# Patient Record
Sex: Female | Born: 1985 | Race: Black or African American | Hispanic: No | Marital: Single | Smoking: Never smoker
Health system: Southern US, Community
[De-identification: ages and names within clinical notes are randomized; demographics above are authoritative.]

## PROBLEM LIST (undated history)

## (undated) DIAGNOSIS — B019 Varicella without complication: Secondary | ICD-10-CM

## (undated) DIAGNOSIS — R519 Headache, unspecified: Secondary | ICD-10-CM

## (undated) DIAGNOSIS — D649 Anemia, unspecified: Secondary | ICD-10-CM

## (undated) DIAGNOSIS — Z8489 Family history of other specified conditions: Secondary | ICD-10-CM

## (undated) DIAGNOSIS — R51 Headache: Secondary | ICD-10-CM

## (undated) DIAGNOSIS — A6000 Herpesviral infection of urogenital system, unspecified: Secondary | ICD-10-CM

## (undated) DIAGNOSIS — T7840XA Allergy, unspecified, initial encounter: Secondary | ICD-10-CM

## (undated) HISTORY — DX: Varicella without complication: B01.9

## (undated) HISTORY — DX: Herpesviral infection of urogenital system, unspecified: A60.00

## (undated) HISTORY — DX: Allergy, unspecified, initial encounter: T78.40XA

## (undated) HISTORY — DX: Headache, unspecified: R51.9

## (undated) HISTORY — DX: Anemia, unspecified: D64.9

## (undated) HISTORY — DX: Headache: R51

---
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
## 2003-03-15 ENCOUNTER — Other Ambulatory Visit: Admission: RE | Admit: 2003-03-15 | Discharge: 2003-03-15 | Payer: Self-pay | Admitting: Obstetrics and Gynecology

## 2003-09-06 ENCOUNTER — Inpatient Hospital Stay (HOSPITAL_COMMUNITY): Admission: AD | Admit: 2003-09-06 | Discharge: 2003-09-06 | Payer: Self-pay | Admitting: Obstetrics and Gynecology

## 2003-10-17 ENCOUNTER — Inpatient Hospital Stay (HOSPITAL_COMMUNITY): Admission: AD | Admit: 2003-10-17 | Discharge: 2003-10-17 | Payer: Self-pay | Admitting: Obstetrics and Gynecology

## 2003-10-19 ENCOUNTER — Inpatient Hospital Stay (HOSPITAL_COMMUNITY): Admission: AD | Admit: 2003-10-19 | Discharge: 2003-10-19 | Payer: Self-pay | Admitting: Obstetrics and Gynecology

## 2003-10-23 ENCOUNTER — Inpatient Hospital Stay (HOSPITAL_COMMUNITY): Admission: AD | Admit: 2003-10-23 | Discharge: 2003-10-23 | Payer: Self-pay | Admitting: Obstetrics and Gynecology

## 2003-10-28 ENCOUNTER — Inpatient Hospital Stay (HOSPITAL_COMMUNITY): Admission: AD | Admit: 2003-10-28 | Discharge: 2003-10-31 | Payer: Self-pay | Admitting: Obstetrics and Gynecology

## 2003-12-01 ENCOUNTER — Other Ambulatory Visit: Admission: RE | Admit: 2003-12-01 | Discharge: 2003-12-01 | Payer: Self-pay | Admitting: Obstetrics and Gynecology

## 2004-12-27 ENCOUNTER — Other Ambulatory Visit: Admission: RE | Admit: 2004-12-27 | Discharge: 2004-12-27 | Payer: Self-pay | Admitting: Obstetrics and Gynecology

## 2006-04-04 ENCOUNTER — Emergency Department: Payer: Self-pay | Admitting: Emergency Medicine

## 2011-04-02 HISTORY — PX: TUBAL LIGATION: SHX77

## 2014-12-31 DIAGNOSIS — A6000 Herpesviral infection of urogenital system, unspecified: Secondary | ICD-10-CM

## 2014-12-31 HISTORY — DX: Herpesviral infection of urogenital system, unspecified: A60.00

## 2015-09-12 ENCOUNTER — Encounter: Payer: Self-pay | Admitting: Family Medicine

## 2015-09-12 ENCOUNTER — Ambulatory Visit (INDEPENDENT_AMBULATORY_CARE_PROVIDER_SITE_OTHER): Payer: BLUE CROSS/BLUE SHIELD | Admitting: Family Medicine

## 2015-09-12 VITALS — BP 144/99 | HR 74 | Temp 98.2°F | Ht 62.5 in | Wt 144.0 lb

## 2015-09-12 DIAGNOSIS — Z13220 Encounter for screening for lipoid disorders: Secondary | ICD-10-CM

## 2015-09-12 DIAGNOSIS — R51 Headache: Secondary | ICD-10-CM

## 2015-09-12 DIAGNOSIS — Z0001 Encounter for general adult medical examination with abnormal findings: Secondary | ICD-10-CM

## 2015-09-12 DIAGNOSIS — Z130 Encounter for screening for diseases of the blood and blood-forming organs and certain disorders involving the immune mechanism: Secondary | ICD-10-CM | POA: Diagnosis not present

## 2015-09-12 DIAGNOSIS — R5383 Other fatigue: Secondary | ICD-10-CM | POA: Diagnosis not present

## 2015-09-12 DIAGNOSIS — R6889 Other general symptoms and signs: Secondary | ICD-10-CM

## 2015-09-12 DIAGNOSIS — R030 Elevated blood-pressure reading, without diagnosis of hypertension: Secondary | ICD-10-CM | POA: Diagnosis not present

## 2015-09-12 DIAGNOSIS — R519 Headache, unspecified: Secondary | ICD-10-CM

## 2015-09-12 DIAGNOSIS — IMO0001 Reserved for inherently not codable concepts without codable children: Secondary | ICD-10-CM

## 2015-09-12 NOTE — Patient Instructions (Signed)
It was nice to see you today.  We will call with your lab results.  Follow up:  2 weeks for BP check.  Take care  Dr. Lacinda Axon

## 2015-09-12 NOTE — Progress Notes (Signed)
Pre visit review using our clinic review tool, if applicable. No additional management support is needed unless otherwise documented below in the visit note. 

## 2015-09-13 DIAGNOSIS — R51 Headache: Secondary | ICD-10-CM

## 2015-09-13 DIAGNOSIS — R519 Headache, unspecified: Secondary | ICD-10-CM | POA: Insufficient documentation

## 2015-09-13 DIAGNOSIS — I10 Essential (primary) hypertension: Secondary | ICD-10-CM | POA: Insufficient documentation

## 2015-09-13 DIAGNOSIS — R5383 Other fatigue: Secondary | ICD-10-CM | POA: Insufficient documentation

## 2015-09-13 DIAGNOSIS — Z0001 Encounter for general adult medical examination with abnormal findings: Secondary | ICD-10-CM | POA: Insufficient documentation

## 2015-09-13 DIAGNOSIS — R030 Elevated blood-pressure reading, without diagnosis of hypertension: Secondary | ICD-10-CM

## 2015-09-13 LAB — COMPREHENSIVE METABOLIC PANEL
ALT: 9 U/L (ref 0–35)
AST: 13 U/L (ref 0–37)
Albumin: 4.3 g/dL (ref 3.5–5.2)
Alkaline Phosphatase: 52 U/L (ref 39–117)
BUN: 9 mg/dL (ref 6–23)
CALCIUM: 9.5 mg/dL (ref 8.4–10.5)
CHLORIDE: 105 meq/L (ref 96–112)
CO2: 24 meq/L (ref 19–32)
CREATININE: 0.88 mg/dL (ref 0.40–1.20)
GFR: 95.87 mL/min (ref >60.00)
Glucose, Bld: 80 mg/dL (ref 70–99)
Potassium: 4.1 mEq/L (ref 3.5–5.1)
Sodium: 137 mEq/L (ref 135–145)
TOTAL PROTEIN: 7.3 g/dL (ref 6.0–8.3)
Total Bilirubin: 0.5 mg/dL (ref 0.2–1.2)

## 2015-09-13 LAB — LIPID PANEL
CHOL/HDL RATIO: 3
CHOLESTEROL: 157 mg/dL (ref 0–200)
HDL: 60.4 mg/dL (ref >39.00)
LDL CALC: 85 mg/dL (ref 0–99)
NonHDL: 96.58
TRIGLYCERIDES: 60 mg/dL (ref 0.0–149.0)
VLDL: 12 mg/dL (ref 0.0–40.0)

## 2015-09-13 LAB — CBC
HEMATOCRIT: 30.3 % — AB (ref 36.0–46.0)
HEMOGLOBIN: 9.5 g/dL — AB (ref 12.0–15.0)
MCHC: 31.4 g/dL (ref 30.0–36.0)
MCV: 74.4 fl — ABNORMAL LOW (ref 78.0–100.0)
PLATELETS: 364 10*3/uL (ref 150.0–400.0)
RBC: 4.06 Mil/uL (ref 3.87–5.11)
RDW: 18.5 % — ABNORMAL HIGH (ref 11.5–15.5)
WBC: 6.9 10*3/uL (ref 4.0–10.5)

## 2015-09-13 LAB — TSH: TSH: 1.04 u[IU]/mL (ref 0.35–4.50)

## 2015-09-13 NOTE — Assessment & Plan Note (Signed)
New problem. BP elevated today and on repeat. Patient to keep a log of her blood pressures. Return in 2 weeks for repeat check

## 2015-09-13 NOTE — Assessment & Plan Note (Signed)
New problem. Unclear etiology. Feeling better today. Will follow.

## 2015-09-13 NOTE — Assessment & Plan Note (Signed)
New problem.  ? Tension headache. No headache currently. Advised PRN Tylenol/motrin.

## 2015-09-13 NOTE — Progress Notes (Signed)
Subjective:  Patient ID: Vanessa Chaney, female    DOB: 1985-12-08  Age: 30 y.o. MRN: [id number]  CC: Establish care; Not feeling well.   HPI Vanessa Chaney is a 30 y.o. female presents to the clinic today to establish care. Also states she's not feeling well.  Preventative Healthcare  Pap smear: Up to date. June 2015.  Mammogram: N/A.  Immunizations  Tetanus - 2013.   Labs: Will wait on labs at this time.   Exercise: Does not exercise regular.  Alcohol use: See below.  Smoking/tobacco use: Nonsmoker.  STD/HIV testing: Has had prior screening.  Regular dental exams: Yes.   Wears seat belt: Yes.   Not feeling well  Patient reports that she's not been feeling well for the past week.  She states that for 5 days ago she had nausea for 2 days and this subsequently resolved.  Patient is also been experiencing headache over the past week.  Headache is located in the frontal and parietal regions. Moderate in severity.  No known exacerbating or relieving factors. She states that she does not have a headache at this time.  She states that given her symptoms, she's been taking her blood pressure at work and it has been elevated in the 573U to 202 systolic.  She is concerned about this.  Additionally, patient reports fatigue.  PMH, Surgical Hx, Family Hx, Social History reviewed and updated as below.  Past Medical History  Diagnosis Date  . Chicken pox   . Frequent headaches   . Allergy    Past Surgical History  Procedure Laterality Date  . Cesarean section  2005,2009,2013  . Tubal ligation  2013   Family History  Problem Relation Age of Onset  . Hypertension Mother   . Prostate cancer Father    Social History  Substance Use Topics  . Smoking status: Never Smoker   . Smokeless tobacco: Never Used  . Alcohol Use: 0.0 - 0.6 oz/week    0-1 Standard drinks or equivalent per week   Review of Systems  Constitutional: Positive for fatigue.  Neurological:  Positive for headaches.  All other systems reviewed and are negative.  Objective:   Today's Vitals: BP 144/99 mmHg  Pulse 74  Temp(Src) 98.2 F (36.8 C) (Oral)  Ht 5' 2.5" (1.588 m)  Wt 144 lb (65.318 kg)  BMI 25.90 kg/m2  SpO2 99%  Physical Exam  Constitutional: She is oriented to person, place, and time. She appears well-developed and well-nourished. No distress.  HENT:  Head: Normocephalic and atraumatic.  Nose: Nose normal.  Mouth/Throat: Oropharynx is clear and moist. No oropharyngeal exudate.  Normal TM's bilaterally.   Eyes: Conjunctivae are normal. No scleral icterus.  Neck: Neck supple. No thyromegaly present.  Cardiovascular: Normal rate and regular rhythm.   No murmur heard. Pulmonary/Chest: Effort normal and breath sounds normal. She has no wheezes. She has no rales.  Abdominal: Soft. She exhibits no distension. There is no tenderness. There is no rebound and no guarding.  Musculoskeletal: Normal range of motion. She exhibits no edema.  Lymphadenopathy:    She has no cervical adenopathy.  Neurological: She is alert and oriented to person, place, and time.  Skin: Skin is warm and dry. No rash noted.  Psychiatric: She has a normal mood and affect.  Vitals reviewed.  Assessment & Plan:   Problem List Items Addressed This Visit    Elevated BP    New problem. BP elevated today and on repeat. Patient to keep  a log of her blood pressures. Return in 2 weeks for repeat check      Relevant Orders   Comp Met (CMET)   Encounter for preventative adult health care exam with abnormal findings - Primary    Preventative healthcare up-to-date - has had pap smear, tdap, HIV/STI screening.        Headache    New problem.  ? Tension headache. No headache currently. Advised PRN Tylenol/motrin.      Other fatigue    New problem. Unclear etiology. Feeling better today. Will follow.      Relevant Orders   TSH    Other Visit Diagnoses    Screening for deficiency  anemia        Relevant Orders    CBC    Screening, lipid        Relevant Orders    Lipid Profile      Follow-up: 2 weeks for BP check with Nurse  Gerber

## 2015-09-13 NOTE — Assessment & Plan Note (Signed)
Preventative healthcare up-to-date - has had pap smear, tdap, HIV/STI screening.

## 2015-09-20 ENCOUNTER — Other Ambulatory Visit: Payer: Self-pay | Admitting: Family Medicine

## 2015-09-20 ENCOUNTER — Other Ambulatory Visit (INDEPENDENT_AMBULATORY_CARE_PROVIDER_SITE_OTHER): Payer: BLUE CROSS/BLUE SHIELD

## 2015-09-20 ENCOUNTER — Telehealth: Payer: Self-pay | Admitting: *Deleted

## 2015-09-20 DIAGNOSIS — D649 Anemia, unspecified: Secondary | ICD-10-CM

## 2015-09-20 LAB — RETICULOCYTES
ABS RETIC: 37710 {cells}/uL (ref 20000–80000)
RBC.: 4.19 MIL/uL (ref 3.80–5.10)
Retic Ct Pct: 0.9 %

## 2015-09-20 NOTE — Telephone Encounter (Signed)
Pt coming in for labs this afternoon. Need lab & dx please

## 2015-09-21 ENCOUNTER — Other Ambulatory Visit: Payer: Self-pay | Admitting: Family Medicine

## 2015-09-21 LAB — IRON AND TIBC
%SAT: 7 % — ABNORMAL LOW (ref 11–50)
IRON: 28 ug/dL — AB (ref 40–190)
TIBC: 397 ug/dL (ref 250–450)
UIBC: 369 ug/dL (ref 125–400)

## 2015-09-21 LAB — IBC PANEL
IRON: 27 ug/dL — AB (ref 42–145)
Saturation Ratios: 5.8 % — ABNORMAL LOW (ref 20.0–50.0)
Transferrin: 331 mg/dL (ref 212.0–360.0)

## 2015-09-21 LAB — FERRITIN: FERRITIN: 4.2 ng/mL — AB (ref 10.0–291.0)

## 2015-09-21 LAB — IRON: Iron: 27 ug/dL — ABNORMAL LOW (ref 42–145)

## 2015-09-21 MED ORDER — FERROUS SULFATE 324 (65 FE) MG PO TBEC
DELAYED_RELEASE_TABLET | ORAL | Status: DC
Start: 2015-09-21 — End: 2016-06-04

## 2015-09-22 ENCOUNTER — Telehealth: Payer: Self-pay | Admitting: *Deleted

## 2015-09-22 NOTE — Telephone Encounter (Signed)
Patient requested lab results from 09/20/15 Patient contact [phone]

## 2015-09-22 NOTE — Telephone Encounter (Signed)
Left message for patient to call back for results.  

## 2015-09-27 ENCOUNTER — Telehealth: Payer: Self-pay | Admitting: Family Medicine

## 2015-09-27 NOTE — Telephone Encounter (Signed)
Results note was closed.  Documented here that spoke with patient, reviewed results with her. thanks

## 2015-09-27 NOTE — Telephone Encounter (Signed)
Pt called to get her lab results from 09/20/15. Thank you!  Call pt @ [phone].

## 2016-06-04 ENCOUNTER — Encounter: Payer: Self-pay | Admitting: Obstetrics and Gynecology

## 2016-06-04 ENCOUNTER — Ambulatory Visit (INDEPENDENT_AMBULATORY_CARE_PROVIDER_SITE_OTHER): Payer: BLUE CROSS/BLUE SHIELD | Admitting: Obstetrics and Gynecology

## 2016-06-04 VITALS — BP 116/72 | Ht 62.0 in | Wt 156.0 lb

## 2016-06-04 DIAGNOSIS — Z113 Encounter for screening for infections with a predominantly sexual mode of transmission: Secondary | ICD-10-CM

## 2016-06-04 DIAGNOSIS — A6004 Herpesviral vulvovaginitis: Secondary | ICD-10-CM | POA: Diagnosis not present

## 2016-06-04 DIAGNOSIS — N898 Other specified noninflammatory disorders of vagina: Secondary | ICD-10-CM

## 2016-06-04 DIAGNOSIS — Z124 Encounter for screening for malignant neoplasm of cervix: Secondary | ICD-10-CM

## 2016-06-04 DIAGNOSIS — N760 Acute vaginitis: Secondary | ICD-10-CM

## 2016-06-04 DIAGNOSIS — B9689 Other specified bacterial agents as the cause of diseases classified elsewhere: Secondary | ICD-10-CM | POA: Insufficient documentation

## 2016-06-04 DIAGNOSIS — Z01419 Encounter for gynecological examination (general) (routine) without abnormal findings: Secondary | ICD-10-CM | POA: Diagnosis not present

## 2016-06-04 LAB — POCT WET PREP WITH KOH
CLUE CELLS WET PREP PER HPF POC: NEGATIVE
KOH PREP POC: NEGATIVE
TRICHOMONAS UA: NEGATIVE
YEAST WET PREP PER HPF POC: NEGATIVE

## 2016-06-04 MED ORDER — VALACYCLOVIR HCL 500 MG PO TABS: ORAL_TABLET | ORAL | 11 refills | Status: DC

## 2016-06-04 NOTE — Progress Notes (Signed)
HPI:      [name] is a 31 y.o. [id number] who LMP was Patient's last menstrual period was 05/22/2016 (exact date)., presents today for her annual examination.  Her menses are regular every 28-30 days, lasting 5 day(s). Dysmenorrhea none.   She is single partner, contraception - tubal ligation.  Last Pap: 02/14/14 Results were: no abnormalities  /neg HPV DNA Hx of STDs: HSV. She has outbreaks every couple of months. She takes valtrex prn sx. She needs a Rx Rf. She would like full STD screen as preventive. She has had vaginal d/c and odor and hx of BV in the past, but no sx today other than d/c.   There is no FH of breast cancer. MGGM had ovar cancer. Genetic tesitng not indicated. The patient does not do self-breast exams.  Tobacco use: The patient denies current or previous tobacco use. Alcohol use: none Exercise: moderately active  She does get adequate calcium and Vitamin D in her diet.   Past Medical History:  Diagnosis Date  . Allergy   . Chicken pox   . Frequent headaches   . Genital herpes 12/2014   pos HSV culture    Past Surgical History:  Procedure Laterality Date  . CESAREAN SECTION  2005,2009,2013  . TUBAL LIGATION  2013    Family History  Problem Relation Age of Onset  . Hypertension Mother   . Prostate cancer Father   . Uterine cancer Paternal Aunt 105  . Ovarian cancer Other      ROS:  Review of Systems  Constitutional: Negative for fever, malaise/fatigue and weight loss.  HENT: Negative for congestion, ear pain and sinus pain.   Respiratory: Negative for cough, shortness of breath and wheezing.   Cardiovascular: Negative for chest pain, orthopnea and leg swelling.  Gastrointestinal: Negative for constipation, diarrhea, nausea and vomiting.  Genitourinary: Negative for dysuria, frequency, hematuria and urgency.       Breast ROS: negative   Musculoskeletal: Negative for back pain, joint pain and myalgias.  Skin: Negative for itching and  rash.  Neurological: Positive for headaches. Negative for dizziness, tingling and focal weakness.  Endo/Heme/Allergies: Negative for environmental allergies. Does not bruise/bleed easily.  Psychiatric/Behavioral: Negative for depression and suicidal ideas. The patient is not nervous/anxious and does not have insomnia.     Objective: BP 116/72   Ht 5\' 2"  (1.575 m)   Wt 156 lb (70.8 kg)   LMP 05/22/2016 (Exact Date)   BMI 28.53 kg/m    Physical Exam  Constitutional: She is oriented to person, place, and time. She appears well-developed and well-nourished.  Genitourinary: Uterus normal. No erythema or tenderness in the vagina. Vaginal discharge found. Right adnexum does not display mass and does not display tenderness. Left adnexum does not display mass and does not display tenderness. Cervix does not exhibit motion tenderness or polyp. Uterus is not enlarged or tender.  Neck: Normal range of motion. No thyromegaly present.  Cardiovascular: Normal rate, regular rhythm and normal heart sounds.   No murmur heard. Pulmonary/Chest: Effort normal and breath sounds normal. Right breast exhibits no mass, no nipple discharge, no skin change and no tenderness. Left breast exhibits no mass, no nipple discharge, no skin change and no tenderness.  Abdominal: Soft. There is no tenderness. There is no guarding.  Musculoskeletal: Normal range of motion.  Neurological: She is alert and oriented to person, place, and time. No cranial nerve deficit.  Psychiatric: She has a normal mood and  affect. Her behavior is normal.  Vitals reviewed.   Results: Results for orders placed or performed in visit on 06/04/16 (from the past 24 hour(s))  POCT Wet Prep with KOH     Status: Normal   Collection Time: 06/04/16  4:00 PM  Result Value Ref Range   Trichomonas, UA Negative    Clue Cells Wet Prep HPF POC neg    Epithelial Wet Prep HPF POC  Few, Moderate, Many, Too numerous to count   Yeast Wet Prep HPF POC neg      Bacteria Wet Prep HPF POC Few Few   RBC Wet Prep HPF POC     WBC Wet Prep HPF POC     KOH Prep POC Negative Negative    Assessment: Encounter for annual routine gynecological examination - Plan: IGP,CtNg,AptimaHPV  Screening for STD (sexually transmitted disease) - Plan: IGP,CtNg,AptimaHPV, RPR Qual, Hepatitis C Antibody, HIV antibody  Vaginal discharge - Neg wet prep. Reassurance. Check nuswab. F/u prn.  - Plan: POCT Wet Prep with KOH  Herpes simplex vulvovaginitis - Rx Valtrex to take episodically or daily as preventive. - Plan: valACYclovir (VALTREX) 500 MG tablet  Screening for cervical cancer - Plan: IGP,CtNg,AptimaHPV     Plan:            GYN counsel adequate intake of calcium and vitamin D     F/U  Return in about 1 year (around 06/04/2017), or if symptoms worsen or fail to improve.  Nykeem Citro B. Analys Ryden, PA-C 06/04/2016 9:12 PM

## 2016-06-05 LAB — HIV ANTIBODY (ROUTINE TESTING W REFLEX): HIV Screen 4th Generation wRfx: NONREACTIVE

## 2016-06-05 LAB — HEPATITIS C ANTIBODY: HEP C VIRUS AB: 0.1 {s_co_ratio} (ref 0.0–0.9)

## 2016-06-05 LAB — RPR QUALITATIVE: RPR: NONREACTIVE

## 2016-06-07 ENCOUNTER — Telehealth: Payer: Self-pay | Admitting: Obstetrics and Gynecology

## 2016-06-07 DIAGNOSIS — A749 Chlamydial infection, unspecified: Secondary | ICD-10-CM

## 2016-06-07 LAB — IGP,CTNG,APTIMAHPV
CHLAMYDIA, NUC. ACID AMP: POSITIVE — AB
Gonococcus by Nucleic Acid Amp: NEGATIVE
HPV APTIMA: NEGATIVE
PAP SMEAR COMMENT: 0

## 2016-06-07 MED ORDER — DOXYCYCLINE HYCLATE 100 MG PO CAPS: 100.0000 mg | ORAL_CAPSULE | Freq: Two times a day (BID) | ORAL | 0 refills | Status: DC

## 2016-06-07 NOTE — Telephone Encounter (Signed)
Pt aware of results and rx sent in.

## 2016-06-07 NOTE — Telephone Encounter (Signed)
RN to call pt and notify her of chlamydia dx. Rx doxy eRxd. Partner needs tx. Abstinence till both treated. RTO 3 wks for [facility]  RN to send form to [facility].

## 2018-04-16 ENCOUNTER — Encounter: Payer: Self-pay | Admitting: *Deleted

## 2018-04-16 ENCOUNTER — Other Ambulatory Visit: Payer: Self-pay

## 2018-04-16 ENCOUNTER — Observation Stay
Admission: EM | Admit: 2018-04-16 | Discharge: 2018-04-17 | Disposition: A | Discharge status: Home / Self Care | Payer: Managed Care, Other (non HMO) | Attending: Internal Medicine | Admitting: Internal Medicine

## 2018-04-16 ENCOUNTER — Emergency Department: Payer: Managed Care, Other (non HMO)

## 2018-04-16 DIAGNOSIS — Z8042 Family history of malignant neoplasm of prostate: Secondary | ICD-10-CM | POA: Diagnosis not present

## 2018-04-16 DIAGNOSIS — D509 Iron deficiency anemia, unspecified: Secondary | ICD-10-CM | POA: Diagnosis not present

## 2018-04-16 DIAGNOSIS — M25512 Pain in left shoulder: Secondary | ICD-10-CM | POA: Diagnosis not present

## 2018-04-16 DIAGNOSIS — Z79899 Other long term (current) drug therapy: Secondary | ICD-10-CM | POA: Diagnosis not present

## 2018-04-16 DIAGNOSIS — R51 Headache: Secondary | ICD-10-CM | POA: Diagnosis not present

## 2018-04-16 DIAGNOSIS — R0789 Other chest pain: Secondary | ICD-10-CM | POA: Diagnosis not present

## 2018-04-16 DIAGNOSIS — Z8049 Family history of malignant neoplasm of other genital organs: Secondary | ICD-10-CM | POA: Insufficient documentation

## 2018-04-16 DIAGNOSIS — Z23 Encounter for immunization: Secondary | ICD-10-CM | POA: Diagnosis not present

## 2018-04-16 DIAGNOSIS — Z8249 Family history of ischemic heart disease and other diseases of the circulatory system: Secondary | ICD-10-CM | POA: Diagnosis not present

## 2018-04-16 DIAGNOSIS — R079 Chest pain, unspecified: Secondary | ICD-10-CM | POA: Diagnosis present

## 2018-04-16 DIAGNOSIS — I200 Unstable angina: Secondary | ICD-10-CM

## 2018-04-16 DIAGNOSIS — Z91040 Latex allergy status: Secondary | ICD-10-CM | POA: Diagnosis not present

## 2018-04-16 DIAGNOSIS — R9431 Abnormal electrocardiogram [ECG] [EKG]: Secondary | ICD-10-CM

## 2018-04-16 LAB — CBC
HEMATOCRIT: 35.5 % — AB (ref 36.0–46.0)
HEMOGLOBIN: 10.8 g/dL — AB (ref 12.0–15.0)
MCH: 23.4 pg — ABNORMAL LOW (ref 26.0–34.0)
MCHC: 30.4 g/dL (ref 30.0–36.0)
MCV: 77 fL — ABNORMAL LOW (ref 80.0–100.0)
Platelets: 382 10*3/uL (ref 150–400)
RBC: 4.61 MIL/uL (ref 3.87–5.11)
RDW: 16.2 % — ABNORMAL HIGH (ref 11.5–15.5)
WBC: 8.2 10*3/uL (ref 4.0–10.5)
nRBC: 0 % (ref 0.0–0.2)

## 2018-04-16 LAB — BASIC METABOLIC PANEL
Anion gap: 10 (ref 5–15)
BUN: 9 mg/dL (ref 6–20)
CO2: 23 mmol/L (ref 22–32)
Calcium: 9.6 mg/dL (ref 8.9–10.3)
Chloride: 104 mmol/L (ref 98–111)
Creatinine, Ser: 0.76 mg/dL (ref 0.44–1.00)
GFR calc Af Amer: >60 mL/min (ref >60)
GFR calc non Af Amer: >60 mL/min (ref >60)
Glucose, Bld: 79 mg/dL (ref 70–99)
POTASSIUM: 3.8 mmol/L (ref 3.5–5.1)
Sodium: 137 mmol/L (ref 135–145)

## 2018-04-16 LAB — LIPID PANEL
Cholesterol: 163 mg/dL (ref 0–200)
HDL: 66 mg/dL (ref >40)
LDL Cholesterol: 85 mg/dL (ref 0–99)
TRIGLYCERIDES: 61 mg/dL (ref <150)
Total CHOL/HDL Ratio: 2.5 RATIO
VLDL: 12 mg/dL (ref 0–40)

## 2018-04-16 LAB — POCT PREGNANCY, URINE: Preg Test, Ur: NEGATIVE

## 2018-04-16 LAB — IRON AND TIBC
Iron: 23 ug/dL — ABNORMAL LOW (ref 28–170)
Saturation Ratios: 6 % — ABNORMAL LOW (ref 10.4–31.8)
TIBC: 385 ug/dL (ref 250–450)
UIBC: 362 ug/dL

## 2018-04-16 LAB — TROPONIN I
Troponin I: <0.03 ng/mL (ref <0.03)
Troponin I: <0.03 ng/mL (ref <0.03)

## 2018-04-16 MED ORDER — DOCUSATE SODIUM 100 MG PO CAPS: 100.0000 mg | ORAL_CAPSULE | Freq: Two times a day (BID) | ORAL | Status: DC | PRN

## 2018-04-16 MED ORDER — SODIUM CHLORIDE 0.9% FLUSH
10.0000 mL | Freq: Two times a day (BID) | INTRAVENOUS | Status: DC
  Administered 2018-04-16 – 2018-04-17 (×2): 10 mL via INTRAVENOUS

## 2018-04-16 MED ORDER — ACETAMINOPHEN 325 MG PO TABS
650.0000 mg | ORAL_TABLET | Freq: Four times a day (QID) | ORAL | Status: DC | PRN
  Administered 2018-04-16: 650 mg via ORAL
  Filled 2018-04-16: qty 2

## 2018-04-16 MED ORDER — ADULT MULTIVITAMIN W/MINERALS CH
1.0000 | ORAL_TABLET | Freq: Every day | ORAL | Status: DC
  Administered 2018-04-17: 1 via ORAL
  Filled 2018-04-16: qty 1

## 2018-04-16 MED ORDER — NITROGLYCERIN 0.4 MG SL SUBL: 0.4000 mg | SUBLINGUAL_TABLET | SUBLINGUAL | Status: DC | PRN

## 2018-04-16 MED ORDER — INFLUENZA VAC SPLIT QUAD 0.5 ML IM SUSY
0.5000 mL | PREFILLED_SYRINGE | INTRAMUSCULAR | Status: AC
  Administered 2018-04-17: 0.5 mL via INTRAMUSCULAR
  Filled 2018-04-16: qty 0.5

## 2018-04-16 NOTE — ED Triage Notes (Signed)
Refer to First nurse note

## 2018-04-16 NOTE — Progress Notes (Signed)
Called Dr. Anselm Jungling asking about something for pain. Ordered Tylenol.

## 2018-04-16 NOTE — ED Notes (Signed)
Attempted report to floor.  

## 2018-04-16 NOTE — ED Provider Notes (Signed)
[facility] Emergency Department Provider Note  ____________________________________________  Time seen: Approximately 5:04 PM  I have reviewed the triage vital signs and the nursing notes.   HISTORY  Chief Complaint Chest Pain   HPI Vanessa Chaney is a 33 y.o. female no significant past medical history who presents for evaluation of chest pain.  Patient reports that yesterday she was sitting in a class for her work and when she started having sharp pain in her left arm.  She reports that she took an aspirin and the pain went away.  Today she was sitting again when she started having the same sharp pain in her left arm this time was radiating to her left chest.  No shortness of breath, no dizziness, no diaphoresis, no nausea or vomiting.  She went to urgent care.  She was given aspirin and nitroglycerin with resolution of her pain.  She has no pain at this time.  Patient denies any prior episode of chest pain.  No personal family history of heart attacks, PE or DVT, no recent travel immobilization, no leg pain or swelling, no hemoptysis, no exogenous hormones, no history of cancer.  No cough, congestion, fever or chills.  No abdominal pain.  Past Medical History:  Diagnosis Date  . Allergy   . Chicken pox   . Frequent headaches   . Genital herpes 12/2014   pos HSV culture    Patient Active Problem List   Diagnosis Date Noted  . Chlamydia 06/07/2016  . BV (bacterial vaginosis) 06/04/2016  . Encounter for preventative adult health care exam with abnormal findings 09/13/2015  . Elevated BP 09/13/2015  . Other fatigue 09/13/2015  . Headache 09/13/2015    Past Surgical History:  Procedure Laterality Date  . CESAREAN SECTION  2005,2009,2013  . TUBAL LIGATION  2013    Prior to Admission medications   Medication Sig Start Date End Date Taking? Authorizing Provider  doxycycline (VIBRAMYCIN) 100 MG capsule Take 1 capsule (100 mg total) by mouth 2 (two)  times daily. 09/05/27   Copland, Deirdre Evener, PA-C  Multiple Vitamin (MULTIVITAMIN WITH MINERALS) TABS tablet Take 1 tablet by mouth daily.    [provider]  valACYclovir (VALTREX) 500 MG tablet Take 1 tablet daily as preventive or BID for 3 days prn symptoms 07/07/63   Copland, Alicia B, PA-C    Allergies Latex  Family History  Problem Relation Age of Onset  . Hypertension Mother   . Prostate cancer Father   . Uterine cancer Paternal Aunt 84  . Ovarian cancer Other     Social History Social History   Tobacco Use  . Smoking status: Never Smoker  . Smokeless tobacco: Never Used  Substance Use Topics  . Alcohol use: Yes    Alcohol/week: 0.0 - 1.0 standard drinks  . Drug use: No    Review of Systems  Constitutional: Negative for fever. Eyes: Negative for visual changes. ENT: Negative for sore throat. Neck: No neck pain  Cardiovascular: + chest pain. Respiratory: Negative for shortness of breath. Gastrointestinal: Negative for abdominal pain, vomiting or diarrhea. Genitourinary: Negative for dysuria. Musculoskeletal: Negative for back pain. Skin: Negative for rash. Neurological: Negative for headaches, weakness or numbness. Psych: No SI or HI  ____________________________________________   PHYSICAL EXAM:  VITAL SIGNS: ED Triage Vitals  Enc Vitals Group     BP 04/16/18 1632 (!) 155/81     Pulse Rate 04/16/18 1632 65     Resp 04/16/18 1632 18  Temp 04/16/18 1632 98.7 F (37.1 C)     Temp Source 04/16/18 1632 Oral     SpO2 04/16/18 1632 100 %     Weight 04/16/18 1633 165 lb (74.8 kg)     Height 04/16/18 1633 5\' 2"  (1.575 m)     Head Circumference --      Peak Flow --      Pain Score 04/16/18 1644 0     Pain Loc --      Pain Edu? --      Excl. in [place]? --     Constitutional: Alert and oriented. Well appearing and in no apparent distress. HEENT:      Head: Normocephalic and atraumatic.         Eyes: Conjunctivae are normal. Sclera is non-icteric.        Mouth/Throat: Mucous membranes are moist.       Neck: Supple with no signs of meningismus. Cardiovascular: Regular rate and rhythm. No murmurs, gallops, or rubs. 2+ symmetrical distal pulses are present in all extremities. No JVD. Respiratory: Normal respiratory effort. Lungs are clear to auscultation bilaterally. No wheezes, crackles, or rhonchi.  Gastrointestinal: Soft, non tender, and non distended with positive bowel sounds. No rebound or guarding. Musculoskeletal: Nontender with normal range of motion in all extremities. No edema, cyanosis, or erythema of extremities. Neurologic: Normal speech and language. Face is symmetric. Moving all extremities. No gross focal neurologic deficits are appreciated. Skin: Skin is warm, dry and intact. No rash noted. Psychiatric: Mood and affect are normal. Speech and behavior are normal.  ____________________________________________   LABS (all labs ordered are listed, but only abnormal results are displayed)  Labs Reviewed  CBC - Abnormal; Notable for the following components:      Result Value   Hemoglobin 10.8 (*)    HCT 35.5 (*)    MCV 77.0 (*)    MCH 23.4 (*)    RDW 16.2 (*)    All other components within normal limits  BASIC METABOLIC PANEL  TROPONIN I  TROPONIN I  POCT PREGNANCY, URINE  POC URINE PREG, ED   ____________________________________________  EKG  ED ECG REPORT I, Rudene Re, the attending physician, personally viewed and interpreted this ECG.  Sinus bradycardia, rate of 57, T wave inversions in V1 to V3, no ST elevations or depressions, normal intervals, normal axis.  No prior for comparison. ____________________________________________  RADIOLOGY  I have personally reviewed the images performed during this visit and I agree with the Radiologist's read.   Interpretation by Radiologist:  Dg Chest 2 View  Result Date: 04/16/2018 CLINICAL DATA:  Left arm and back pain earlier today. EXAM: CHEST - 2 VIEW  COMPARISON:  None. FINDINGS: The heart size and mediastinal contours are within normal limits. Both lungs are clear. The visualized skeletal structures are unremarkable. IMPRESSION: No active cardiopulmonary disease. Electronically Signed   By: Misty Stanley M.D.   On: 04/16/2018 18:01      ____________________________________________   PROCEDURES  Procedure(s) performed: None Procedures Critical Care performed:  None ____________________________________________   INITIAL IMPRESSION / ASSESSMENT AND PLAN / ED COURSE  33 y.o. female no significant past medical history who presents for evaluation of chest pain x 2. Pain relieved by nitro. Currently asymptomatic. Sent from UC for TWI in anterior leads. No prior for comparison. Heart score of 1. Received ASA pta.  Will monitor on telemetry, cycle troponin x2, check basic labs and chest x-ray.  Low suspicion for PE, PERC negative, especially in  the setting of pain resolving.  Low suspicion for dissection with no neuro deficits, no severely elevated high blood pressure, no pain radiating to the back, intermittent pain, and intact pulses x4.    _________________________ 8:25 PM on 04/16/2018 -----------------------------------------  I am able to review the EKG done at the UC earlier today when patient was having pain. That EKG shows TWI in V2-V4 and the one here shows only V2 inversion while patient is pain free. First troponin is negative. I discussed with Dr. Haroldine Laws from cardiology who said these changes could be due to lead misplacement but since she has these TWI and pain relieved by nitro that he recommends admitting her for further evaluation. Patient remains pain free. Will discuss with Hospitalist for admission.    As part of my medical decision making, I reviewed the following data within the [facility] notes reviewed and incorporated, Labs reviewed , EKG interpreted , Radiograph reviewed , A consult was  requested and obtained from this/these consultant(s) Cardiology, Notes from prior ED visits and [place] Controlled Substance Database    Pertinent labs & imaging results that were available during my care of the patient were reviewed by me and considered in my medical decision making (see chart for details).    ____________________________________________   FINAL CLINICAL IMPRESSION(S) / ED DIAGNOSES  Final diagnoses:  Chest pain, unspecified type      NEW MEDICATIONS STARTED DURING THIS VISIT:  ED Discharge Orders    None       Note:  This document was prepared using Dragon voice recognition software and may include unintentional dictation errors.    Rudene Re, MD 04/16/18 2027

## 2018-04-16 NOTE — ED Triage Notes (Signed)
First Nurse Note:  Arrives via [facility].  Referred from [facility]gent care for evaluation of CP.  Per EMS report, patient c/o left arm pain and numbness since yesterday.  Patient took an ASA yesterday which stopped pain.  Patient given ASA and NTG at urgent care, which reduced pain.  On EKG from Urgent care, patient has T wave inversion in V2 V3.

## 2018-04-16 NOTE — ED Notes (Signed)
Attempted report again. 

## 2018-04-16 NOTE — H&P (Signed)
[place] [facility] [place] NAME: Vanessa Chaney    MR#:  [id number]  DATE OF BIRTH:  12/27/1985  DATE OF ADMISSION:  04/16/2018  PRIMARY CARE PHYSICIAN: Patient, No Pcp Per   REQUESTING/REFERRING PHYSICIAN: Alfred Levins  CHIEF COMPLAINT:   Chief Complaint  Patient presents with  . Chest Pain    HISTORY OF PRESENT ILLNESS: Vanessa Chaney  is a 33 y.o. female with a known history of Allergy, herpes- had left arm pain yesterday- recovered after getting aspirin tablet. She had pain in left arm again today which was radiating to left shoulder. She went to urgent care center- EKG showed T wave inversion in chest leads, so sent to ER. She ws also given Nitro tab at urgent care, which resolved pain. In ER EKG and Troponin negative. ER physician reviewed EKG with CArdiologist oncall from [facility]- suggested to obs in hospital as no previous EKG to compare.  PAST MEDICAL HISTORY:   Past Medical History:  Diagnosis Date  . Allergy   . Chicken pox   . Frequent headaches   . Genital herpes 12/2014   pos HSV culture    PAST SURGICAL HISTORY:  Past Surgical History:  Procedure Laterality Date  . CESAREAN SECTION  2005,2009,2013  . TUBAL LIGATION  2013    SOCIAL HISTORY:  Social History   Tobacco Use  . Smoking status: Never Smoker  . Smokeless tobacco: Never Used  Substance Use Topics  . Alcohol use: Yes    Alcohol/week: 0.0 - 1.0 standard drinks    FAMILY HISTORY:  Family History  Problem Relation Age of Onset  . Hypertension Mother   . Prostate cancer Father   . Uterine cancer Paternal Aunt 80  . Ovarian cancer Other     DRUG ALLERGIES:  Allergies  Allergen Reactions  . Latex     REVIEW OF SYSTEMS:   CONSTITUTIONAL: No fever, fatigue or weakness.  EYES: No blurred or double vision.  EARS, NOSE, AND THROAT: No tinnitus or ear pain.  RESPIRATORY: No cough, shortness of breath, wheezing or hemoptysis.  CARDIOVASCULAR: Had chest pain,no  orthopnea, edema.  GASTROINTESTINAL: No nausea, vomiting, diarrhea or abdominal pain.  GENITOURINARY: No dysuria, hematuria.  ENDOCRINE: No polyuria, nocturia,  HEMATOLOGY: No anemia, easy bruising or bleeding SKIN: No rash or lesion. MUSCULOSKELETAL: No joint pain or arthritis.   NEUROLOGIC: No tingling, numbness, weakness.  PSYCHIATRY: No anxiety or depression.   MEDICATIONS AT HOME:  Prior to Admission medications   Medication Sig Start Date End Date Taking? Authorizing Provider  doxycycline (VIBRAMYCIN) 100 MG capsule Take 1 capsule (100 mg total) by mouth 2 (two) times daily. Patient not taking: Reported on 04/16/2018 08/03/98   Copland, Deirdre Evener, PA-C  valACYclovir (VALTREX) 500 MG tablet Take 1 tablet daily as preventive or BID for 3 days prn symptoms Patient not taking: Reported on 04/16/2018 11/05/74   Copland, Deirdre Evener, PA-C      PHYSICAL EXAMINATION:   VITAL SIGNS: Blood pressure (!) 154/85, pulse (!) 48, temperature 98.7 F (37.1 C), temperature source Oral, resp. rate 12, height 5\' 2"  (1.575 m), weight 74.8 kg, last menstrual period 04/02/2018, SpO2 100 %.  GENERAL:  33 y.o.-year-old patient lying in the bed with no acute distress.  EYES: Pupils equal, round, reactive to light and accommodation. No scleral icterus. Extraocular muscles intact.  HEENT: Head atraumatic, normocephalic. Oropharynx and nasopharynx clear.  NECK:  Supple, no jugular venous distention. No thyroid enlargement, no tenderness.  LUNGS:  Normal breath sounds bilaterally, no wheezing, rales,rhonchi or crepitation. No use of accessory muscles of respiration.  CARDIOVASCULAR: S1, S2 normal. No murmurs, rubs, or gallops.  ABDOMEN: Soft, nontender, nondistended. Bowel sounds present. No organomegaly or mass.  EXTREMITIES: No pedal edema, cyanosis, or clubbing.  NEUROLOGIC: Cranial nerves II through XII are intact. Muscle strength 5/5 in all extremities. Sensation intact. Gait not checked.  PSYCHIATRIC: The  patient is alert and oriented x 3.  SKIN: No obvious rash, lesion, or ulcer.   LABORATORY PANEL:   CBC Recent Labs  Lab 04/16/18 1701  WBC 8.2  HGB 10.8*  HCT 35.5*  PLT 382  MCV 77.0*  MCH 23.4*  MCHC 30.4  RDW 16.2*   ------------------------------------------------------------------------------------------------------------------  Chemistries  Recent Labs  Lab 04/16/18 1701  NA 137  K 3.8  CL 104  CO2 23  GLUCOSE 79  BUN 9  CREATININE 0.76  CALCIUM 9.6   ------------------------------------------------------------------------------------------------------------------ estimated creatinine clearance is 93.9 mL/min (by C-G formula based on SCr of 0.76 mg/dL). ------------------------------------------------------------------------------------------------------------------ No results for input(s): TSH, T4TOTAL, T3FREE, THYROIDAB in the last 72 hours.  Invalid input(s): FREET3   Coagulation profile No results for input(s): INR, PROTIME in the last 168 hours. ------------------------------------------------------------------------------------------------------------------- No results for input(s): DDIMER in the last 72 hours. -------------------------------------------------------------------------------------------------------------------  Cardiac Enzymes Recent Labs  Lab 04/16/18 1701  TROPONINI <0.03   ------------------------------------------------------------------------------------------------------------------ Invalid input(s): POCBNP  ---------------------------------------------------------------------------------------------------------------  Urinalysis No results found for: COLORURINE, APPEARANCEUR, LABSPEC, PHURINE, GLUCOSEU, HGBUR, BILIRUBINUR, KETONESUR, PROTEINUR, UROBILINOGEN, NITRITE, LEUKOCYTESUR   RADIOLOGY: Dg Chest 2 View  Result Date: 04/16/2018 CLINICAL DATA:  Left arm and back pain earlier today. EXAM: CHEST - 2 VIEW COMPARISON:   None. FINDINGS: The heart size and mediastinal contours are within normal limits. Both lungs are clear. The visualized skeletal structures are unremarkable. IMPRESSION: No active cardiopulmonary disease. Electronically Signed   By: Misty Stanley M.D.   On: 04/16/2018 18:01    EKG: Orders placed or performed during the hospital encounter of 04/16/18  . EKG 12-Lead  . EKG 12-Lead  . ED EKG  . ED EKG    IMPRESSION AND PLAN:  * Anginal chest pain   Monitor on tele, Follow serial troponin  Check Lipid panel and HBA1c  Cardiologist consult.   Keep NPO in morning, if need any tests by cardiologist,.  * Microcytic anemia   Check Iron studies.  All the records are reviewed and case discussed with ED provider. Management plans discussed with the patient, family and they are in agreement.  CODE STATUS: Full.    TOTAL TIME TAKING CARE OF THIS PATIENT: 45 minutes.    Vaughan Basta M.D on 04/16/2018   Between 7am to 6pm - Pager - [phone]  After 6pm go to [email or web address] - password [email or web address] [place] [facility]  Office  [phone]  CC: Primary care physician; Patient, No Pcp Per   Note: This dictation was prepared with Dragon dictation along with smaller phrase technology. Any transcriptional errors that result from this process are unintentional.

## 2018-04-16 NOTE — ED Notes (Signed)
Significant other at bedside.

## 2018-04-16 NOTE — ED Notes (Signed)
Urine preg neg.

## 2018-04-17 ENCOUNTER — Encounter: Payer: Self-pay | Admitting: Radiology

## 2018-04-17 ENCOUNTER — Observation Stay (HOSPITAL_BASED_OUTPATIENT_CLINIC_OR_DEPARTMENT_OTHER): Payer: Managed Care, Other (non HMO)

## 2018-04-17 DIAGNOSIS — R079 Chest pain, unspecified: Secondary | ICD-10-CM

## 2018-04-17 DIAGNOSIS — I200 Unstable angina: Secondary | ICD-10-CM

## 2018-04-17 DIAGNOSIS — R9431 Abnormal electrocardiogram [ECG] [EKG]: Secondary | ICD-10-CM

## 2018-04-17 DIAGNOSIS — M79602 Pain in left arm: Secondary | ICD-10-CM

## 2018-04-17 LAB — NM MYOCAR MULTI W/SPECT W/WALL MOTION / EF
CHL CUP MPHR: 186 {beats}/min
CHL CUP NUCLEAR SDS: 0
CHL CUP NUCLEAR SSS: 0
Estimated workload: 10.2 METS
Exercise duration (min): 9 min
Exercise duration (sec): 5 s
LV dias vol: 79 mL (ref 46–106)
LVSYSVOL: 25 mL
Peak HR: 162 {beats}/min
Percent HR: 87 %
Rest HR: 59 {beats}/min
SRS: 1
TID: 0.94

## 2018-04-17 LAB — CBC
HCT: 32.3 % — ABNORMAL LOW (ref 36.0–46.0)
Hemoglobin: 9.9 g/dL — ABNORMAL LOW (ref 12.0–15.0)
MCH: 23.5 pg — ABNORMAL LOW (ref 26.0–34.0)
MCHC: 30.7 g/dL (ref 30.0–36.0)
MCV: 76.7 fL — ABNORMAL LOW (ref 80.0–100.0)
Platelets: 329 10*3/uL (ref 150–400)
RBC: 4.21 MIL/uL (ref 3.87–5.11)
RDW: 16.3 % — ABNORMAL HIGH (ref 11.5–15.5)
WBC: 5.7 10*3/uL (ref 4.0–10.5)
nRBC: 0 % (ref 0.0–0.2)

## 2018-04-17 LAB — BASIC METABOLIC PANEL
Anion gap: 4 — ABNORMAL LOW (ref 5–15)
BUN: 10 mg/dL (ref 6–20)
CHLORIDE: 108 mmol/L (ref 98–111)
CO2: 26 mmol/L (ref 22–32)
Calcium: 8.9 mg/dL (ref 8.9–10.3)
Creatinine, Ser: 0.87 mg/dL (ref 0.44–1.00)
GFR calc Af Amer: >60 mL/min (ref >60)
GFR calc non Af Amer: >60 mL/min (ref >60)
Glucose, Bld: 96 mg/dL (ref 70–99)
Potassium: 3.8 mmol/L (ref 3.5–5.1)
Sodium: 138 mmol/L (ref 135–145)

## 2018-04-17 LAB — TROPONIN I
Troponin I: <0.03 ng/mL (ref <0.03)
Troponin I: <0.03 ng/mL (ref <0.03)

## 2018-04-17 LAB — HEMOGLOBIN A1C
Hgb A1c MFr Bld: 5.5 % (ref 4.8–5.6)
Mean Plasma Glucose: 111.15 mg/dL

## 2018-04-17 MED ORDER — TECHNETIUM TC 99M TETROFOSMIN IV KIT
29.0400 | PACK | Freq: Once | INTRAVENOUS | Status: AC | PRN
  Administered 2018-04-17: 29.04 via INTRAVENOUS

## 2018-04-17 MED ORDER — TECHNETIUM TC 99M TETROFOSMIN IV KIT
10.0000 | PACK | Freq: Once | INTRAVENOUS | Status: AC | PRN
  Administered 2018-04-17: 10.13 via INTRAVENOUS

## 2018-04-17 NOTE — Progress Notes (Signed)
Talked to Dr. Tressia Miners and patient's stress test is negative, patient is to be discharge. Went over discharge instructions with the patient. Per patient she will just call her follow-up appointment to see the cardiologist, provided number to call. Discontinue peripheral IV and telemetry monitor. NT to transport patient.

## 2018-04-17 NOTE — Plan of Care (Signed)
  Problem: Clinical Measurements: Goal: Diagnostic test results will improve Outcome: Progressing Goal: Cardiovascular complication will be avoided Outcome: Progressing   Problem: Activity: Goal: Ability to tolerate increased activity will improve Outcome: Progressing   Problem: Cardiac: Goal: Ability to achieve and maintain adequate cardiovascular perfusion will improve Outcome: Progressing

## 2018-04-17 NOTE — Progress Notes (Signed)
[place] [facility] [place] NAME: Vanessa Chaney    MR#:  [id number]  DATE OF BIRTH:  1985/10/28  SUBJECTIVE:  CHIEF COMPLAINT:   Chief Complaint  Patient presents with  . Chest Pain   -Denies any chest pain.  No left arm pain this morning.  For Myoview today  REVIEW OF SYSTEMS:  Review of Systems  Constitutional: Negative for chills and fever.  HENT: Negative for congestion, ear discharge, hearing loss and nosebleeds.   Eyes: Negative for blurred vision and double vision.  Respiratory: Negative for cough, shortness of breath and wheezing.   Cardiovascular: Negative for chest pain and palpitations.  Gastrointestinal: Negative for abdominal pain, constipation, diarrhea, nausea and vomiting.  Genitourinary: Negative for dysuria.  Musculoskeletal: Negative for myalgias.  Neurological: Negative for dizziness, focal weakness, seizures, weakness and headaches.  Psychiatric/Behavioral: Negative for depression.    DRUG ALLERGIES:   Allergies  Allergen Reactions  . Latex     VITALS:  Blood pressure 127/72, pulse (!) 48, temperature 98.7 F (37.1 C), temperature source Oral, resp. rate 17, height 5\' 2"  (1.575 m), weight 74.8 kg, last menstrual period 04/02/2018, SpO2 100 %.  PHYSICAL EXAMINATION:  Physical Exam   GENERAL:  33 y.o.-year-old patient lying in the bed with no acute distress.  EYES: Pupils equal, round, reactive to light and accommodation. No scleral icterus. Extraocular muscles intact.  HEENT: Head atraumatic, normocephalic. Oropharynx and nasopharynx clear.  NECK:  Supple, no jugular venous distention. No thyroid enlargement, no tenderness.  LUNGS: Normal breath sounds bilaterally, no wheezing, rales,rhonchi or crepitation. No use of accessory muscles of respiration.  CARDIOVASCULAR: S1, S2 normal. No murmurs, rubs, or gallops.  ABDOMEN: Soft, nontender, nondistended. Bowel sounds present. No organomegaly or mass.    EXTREMITIES: No pedal edema, cyanosis, or clubbing.  NEUROLOGIC: Cranial nerves II through XII are intact. Muscle strength 5/5 in all extremities. Sensation intact. Gait not checked.  PSYCHIATRIC: The patient is alert and oriented x 3.  SKIN: No obvious rash, lesion, or ulcer.    LABORATORY PANEL:   CBC Recent Labs  Lab 04/17/18 0910  WBC 5.7  HGB 9.9*  HCT 32.3*  PLT 329   ------------------------------------------------------------------------------------------------------------------  Chemistries  Recent Labs  Lab 04/17/18 0910  NA 138  K 3.8  CL 108  CO2 26  GLUCOSE 96  BUN 10  CREATININE 0.87  CALCIUM 8.9   ------------------------------------------------------------------------------------------------------------------  Cardiac Enzymes Recent Labs  Lab 04/17/18 0910  TROPONINI <0.03   ------------------------------------------------------------------------------------------------------------------  RADIOLOGY:  Dg Chest 2 View  Result Date: 04/16/2018 CLINICAL DATA:  Left arm and back pain earlier today. EXAM: CHEST - 2 VIEW COMPARISON:  None. FINDINGS: The heart size and mediastinal contours are within normal limits. Both lungs are clear. The visualized skeletal structures are unremarkable. IMPRESSION: No active cardiopulmonary disease. Electronically Signed   By: Misty Stanley M.D.   On: 04/16/2018 18:01    EKG:   Orders placed or performed during the hospital encounter of 04/16/18  . EKG 12-Lead  . EKG 12-Lead  . ED EKG  . ED EKG    ASSESSMENT AND PLAN:   33 year old female with no significant past medical history, no risk factors presents to hospital secondary to left arm pain and shoulder pain.  1.  Left arm pain/shoulder pain-likely secondary to musculoskeletal causes.  No tenderness or range of motion changes on exam. -Denies any trauma.  Advised to take ibuprofen as needed -However due to EKG changes with  T wave inversions in lateral  leads, she is being seen by cardiologist -for [facility] today- if that is negative, will be discharged home  No other risk factors.  Stable otherwise.     All the records are reviewed and case discussed with Care Management/Social Workerr. Management plans discussed with the patient, family and they are in agreement.  CODE STATUS: Full code  TOTAL TIME TAKING CARE OF THIS PATIENT: 37 minutes.   POSSIBLE D/C TODAY, DEPENDING ON CLINICAL CONDITION.   Gladstone Lighter M.D on 04/17/2018 at 10:38 AM  Between 7am to 6pm - Pager - [phone]  After 6pm go to [email or web address] - password [email or web address] [place] [facility]  Office  [phone]  CC: Primary care physician; Patient, No Pcp Per

## 2018-04-17 NOTE — Consult Note (Signed)
Cardiology Consultation:   Patient ID: CHOLE DRIVER MRN: [id number]; DOB: 11/03/1985  Admit date: 04/16/2018 Date of Consult: 04/17/2018  Primary Care Provider: Patient, No Pcp Per Primary Cardiologist: New to [facility] Physician requesting consult: [place] Reason for consult: Chest pain, abnormal EKG   Patient Profile:   Vanessa Chaney is a 33 y.o. female with no prior cardiac history, who presents via urgent care for left arm pain, abnormal EKG  History of Present Illness:   Ms. Viney reports that she is very active at baseline, has 3 children , works full-time, no restrictions, developing diffuse left arm pain radiating from shoulder down to her hand over the past week She has had several episodes 1 on Wednesday with symptoms resolving on its own.  Was at rest when this happened, felt like a severe bone pain extending down to her hand.  Had recurrence of her symptoms the next day on Thursday again shoulder down to hand also including left posterior shoulder area Symptoms again presenting at rest She did not do anything to cause any musculoskeletal discomfort, no heavy lifting, no falls, no trauma She took some aspirin and symptoms eventually resolved  She was concerned about her symptoms and presented to local urgent care who did EKG EKG showed T wave inversions V1 through V4 No prior EKG for comparison She was referred to the emergency room given her symptoms and left arm pain  Since she has been in the hospital no further episodes Denies any diaphoresis, shortness of breath on exertion  Cardiac enzymes negative x3  Past Medical History:  Diagnosis Date  . Allergy   . Chicken pox   . Frequent headaches   . Genital herpes 12/2014   pos HSV culture    Past Surgical History:  Procedure Laterality Date  . CESAREAN SECTION  2005,2009,2013  . TUBAL LIGATION  2013     Home Medications:  Prior to Admission medications   Medication Sig Start Date End Date Taking?  Authorizing Provider  doxycycline (VIBRAMYCIN) 100 MG capsule Take 1 capsule (100 mg total) by mouth 2 (two) times daily. Patient not taking: Reported on 04/16/2018 12/06/26   Copland, Deirdre Evener, PA-C  valACYclovir (VALTREX) 500 MG tablet Take 1 tablet daily as preventive or BID for 3 days prn symptoms Patient not taking: Reported on 04/16/2018 06/04/60   Copland, Deirdre Evener, PA-C    Inpatient Medications: Scheduled Meds: . multivitamin with minerals  1 tablet Oral Daily  . sodium chloride flush  10 mL Intravenous Q12H   Continuous Infusions:  PRN Meds: acetaminophen, docusate sodium, nitroGLYCERIN  Allergies:    Allergies  Allergen Reactions  . Latex     Social History:   Social History   Socioeconomic History  . Marital status: Single    Spouse name: Not on file  . Number of children: Not on file  . Years of education: Not on file  . Highest education level: Not on file  Occupational History  . Not on file  Social Needs  . Financial resource strain: Not on file  . Food insecurity:    Worry: Not on file    Inability: Not on file  . Transportation needs:    Medical: Not on file    Non-medical: Not on file  Tobacco Use  . Smoking status: Never Smoker  . Smokeless tobacco: Never Used  Substance and Sexual Activity  . Alcohol use: Yes    Alcohol/week: 0.0 - 1.0 standard drinks  . Drug use: No  .  Sexual activity: Yes    Partners: Male    Birth control/protection: Surgical  Lifestyle  . Physical activity:    Days per week: Not on file    Minutes per session: Not on file  . Stress: Not on file  Relationships  . Social connections:    Talks on phone: Not on file    Gets together: Not on file    Attends religious service: Not on file    Active member of club or organization: Not on file    Attends meetings of clubs or organizations: Not on file    Relationship status: Not on file  . Intimate partner violence:    Fear of current or ex partner: Not on file     Emotionally abused: Not on file    Physically abused: Not on file    Forced sexual activity: Not on file  Other Topics Concern  . Not on file  Social History Narrative  . Not on file    Family History:    Family History  Problem Relation Age of Onset  . Hypertension Mother   . Prostate cancer Father   . Uterine cancer Paternal Aunt 39  . Ovarian cancer Other      ROS:  Please see the history of present illness.  Review of Systems  Constitutional: Negative.   Respiratory: Negative.   Cardiovascular: Positive for chest pain.       Left arm pain  Gastrointestinal: Negative.   Musculoskeletal: Negative.   Neurological: Negative.   Psychiatric/Behavioral: Negative.   All other systems reviewed and are negative.   Physical Exam/Data:   Vitals:   04/16/18 2130 04/16/18 2224 04/17/18 0318 04/17/18 0740  BP: (!) 141/98 (!) 163/82 122/67 127/72  Pulse:  (!) 51 (!) 55 (!) 48  Resp: 20 18 18 17   Temp:  98.6 F (37 C) 98.5 F (36.9 C) 98.7 F (37.1 C)  TempSrc:  Oral  Oral  SpO2:  100% 100% 100%  Weight:      Height:       No intake or output data in the 24 hours ending 04/17/18 1504 Last 3 Weights 04/16/2018 06/04/2016 09/12/2015  Weight (lbs) 165 lb 156 lb 144 lb  Weight (kg) 74.844 kg 70.761 kg 65.318 kg     Body mass index is 30.18 kg/m.  General:  Well nourished, well developed, in no acute distress HEENT: normal Lymph: no adenopathy Neck: no JVD Endocrine:  No thryomegaly Vascular: No carotid bruits; FA pulses 2+ bilaterally without bruits  Cardiac:  normal S1, S2; RRR; no murmur  Lungs:  clear to auscultation bilaterally, no wheezing, rhonchi or rales  Abd: soft, nontender, no hepatomegaly  Ext: no edema Musculoskeletal:  No deformities, BUE and BLE strength normal and equal Skin: warm and dry  Neuro:  CNs 2-12 intact, no focal abnormalities noted Psych:  Normal affect   EKG:  The EKG was personally reviewed and demonstrates: Normal sinus rhythm with T wave  inversions V1 through V3 EKG from urgent care showing T wave inversions V1 through V4 Telemetry:  Telemetry was personally reviewed and demonstrates: Normal sinus rhythm  Relevant CV Studies: Stress test pending  Laboratory Data:  Chemistry Recent Labs  Lab 04/16/18 1701 04/17/18 0910  NA 137 138  K 3.8 3.8  CL 104 108  CO2 23 26  GLUCOSE 79 96  BUN 9 10  CREATININE 0.76 0.87  CALCIUM 9.6 8.9  GFRNONAA >60 >60  GFRAA >60 >60  ANIONGAP 10  4*    No results for input(s): PROT, ALBUMIN, AST, ALT, ALKPHOS, BILITOT in the last 168 hours. Hematology Recent Labs  Lab 04/16/18 1701 04/17/18 0910  WBC 8.2 5.7  RBC 4.61 4.21  HGB 10.8* 9.9*  HCT 35.5* 32.3*  MCV 77.0* 76.7*  MCH 23.4* 23.5*  MCHC 30.4 30.7  RDW 16.2* 16.3*  PLT 382 329   Cardiac Enzymes Recent Labs  Lab 04/16/18 1701 04/16/18 2039 04/17/18 0339 04/17/18 0910  TROPONINI <0.03 <0.03 <0.03 <0.03   No results for input(s): TROPIPOC in the last 168 hours.  BNPNo results for input(s): BNP, PROBNP in the last 168 hours.  DDimer No results for input(s): DDIMER in the last 168 hours.  Radiology/Studies:  Dg Chest 2 View  Result Date: 04/16/2018 CLINICAL DATA:  Left arm and back pain earlier today. EXAM: CHEST - 2 VIEW COMPARISON:  None. FINDINGS: The heart size and mediastinal contours are within normal limits. Both lungs are clear. The visualized skeletal structures are unremarkable. IMPRESSION: No active cardiopulmonary disease. Electronically Signed   By: Misty Stanley M.D.   On: 04/16/2018 18:01   Nm Myocar Multi W/spect W/wall Motion / Ef  Result Date: 04/17/2018 Exercise myocardial perfusion imaging study with no significant  ischemia Normal wall motion, EF estimated at 58% No EKG changes concerning for ischemia at peak stress or in recovery. Low risk scan Signed, Esmond Plants, MD, Ph.D [facility]    Assessment and Plan:   1. Chest, arm pain Abnormal EKG with no prior study available for  review Symptoms are somewhat atypical but EKG is markedly abnormal She reports good exercise tolerance but worsening symptoms past several days Cardiac enzymes negative x3 Discussed various treatment options with her, Discussed routine treadmill testing, treadmill Myoview amongst other studies  Also discussed echocardiogram to rule out structural heart disease --After discussion with her we will order treadmill Myoview Clinical exam otherwise benign and does not suggest hypertrophy, no murmur suggesting outflow tract obstruction, and good baseline exercise status --If stress test is low risk, would feel comfortable discharge home without further work-up and follow-up in clinic  2.  Arm pain If stress test is low risk, would continue NSAIDs as needed for left arm pain Would also follow-up with primary care physician for further evaluation  3) abnormal EKG If stress test shows low risk, T wave inversions possibly secondary to her cardiac anatomy mixed with some component of lead placement Slight difference noted between urgent care EKG and EKG in our emergency room/likely lead placement Extent of T wave inversions even on [facility] EKG is still not normal Work-up as above  Case discussed with hospitalist service  Total encounter time more than 110 minutes  Greater than 50% was spent in counseling and coordination of care with the patient    For questions or updates, please contact [facility] Please consult [email or web address] for contact info under     Signed, Ida Rogue, MD  04/17/2018 3:04 PM

## 2018-04-18 LAB — HIV ANTIBODY (ROUTINE TESTING W REFLEX): HIV Screen 4th Generation wRfx: NONREACTIVE

## 2018-04-18 NOTE — Discharge Summary (Signed)
Vanessa Chaney NAME: Vanessa Chaney    MR#:  [id number]  DATE OF BIRTH:  1986/01/18  DATE OF ADMISSION:  04/16/2018   ADMITTING PHYSICIAN: Vanessa Basta, MD  DATE OF DISCHARGE: 04/17/2018  3:22 PM  PRIMARY CARE PHYSICIAN: Patient, No Pcp Per   ADMISSION DIAGNOSIS:   Chest pain, unspecified type [R07.9]  DISCHARGE DIAGNOSIS:   Active Problems:   Chest pain   SECONDARY DIAGNOSIS:   Past Medical History:  Diagnosis Date  . Allergy   . Chicken pox   . Frequent headaches   . Genital herpes 12/2014   pos HSV culture    HOSPITAL COURSE:   33 year old female with no significant past medical history, no risk factors presents to hospital secondary to left arm pain and shoulder pain.  1.  Left arm pain/shoulder pain- secondary to musculoskeletal causes.  No tenderness or range of motion changes on exam. -Denies any trauma.  Advised to take ibuprofen as needed -However due to EKG changes with T wave inversions in lateral leads, she is seen by cardiologist -Status post exercise Myoview which was negative.  No other risk factors.  Stable otherwise.  Being discharged home today  DISCHARGE CONDITIONS:   Guarded  CONSULTS OBTAINED:   Treatment Team:  Vanessa Merritts, MD  DRUG ALLERGIES:   Allergies  Allergen Reactions  . Latex    DISCHARGE MEDICATIONS:   Allergies as of 04/17/2018      Reactions   Latex       Medication List    STOP taking these medications   doxycycline 100 MG capsule Commonly known as:  VIBRAMYCIN   valACYclovir 500 MG tablet Commonly known as:  VALTREX        DISCHARGE INSTRUCTIONS:   1.  PCP follow-up within 1 week  DIET:   Regular diet  ACTIVITY:   Activity as tolerated  OXYGEN:   Home Oxygen: No.  Oxygen Delivery: room air  DISCHARGE LOCATION:   home   If you experience worsening of your admission symptoms, develop shortness of breath, life  threatening emergency, suicidal or homicidal thoughts you must seek medical attention immediately by calling 911 or calling your MD immediately  if symptoms less severe.  You Must read complete instructions/literature along with all the possible adverse reactions/side effects for all the Medicines you take and that have been prescribed to you. Take any new Medicines after you have completely understood and accpet all the possible adverse reactions/side effects.   Please note  You were cared for by a hospitalist during your hospital stay. If you have any questions about your discharge medications or the care you received while you were in the hospital after you are discharged, you can call the unit and asked to speak with the hospitalist on call if the hospitalist that took care of you is not available. Once you are discharged, your primary care physician will handle any further medical issues. Please note that NO REFILLS for any discharge medications will be authorized once you are discharged, as it is imperative that you return to your primary care physician (or establish a relationship with a primary care physician if you do not have one) for your aftercare needs so that they can reassess your need for medications and monitor your lab values.    On the day of Discharge:  VITAL SIGNS:   Blood pressure 127/72, pulse (!) 48, temperature 98.7 F (37.1 C), temperature source Oral, resp.  rate 17, height 5\' 2"  (1.575 m), weight 74.8 kg, last menstrual period 04/02/2018, SpO2 100 %.  PHYSICAL EXAMINATION:    GENERAL:  33 y.o.-year-old patient lying in the bed with no acute distress.  EYES: Pupils equal, round, reactive to light and accommodation. No scleral icterus. Extraocular muscles intact.  HEENT: Head atraumatic, normocephalic. Oropharynx and nasopharynx clear.  NECK:  Supple, no jugular venous distention. No thyroid enlargement, no tenderness.  LUNGS: Normal breath sounds bilaterally, no  wheezing, rales,rhonchi or crepitation. No use of accessory muscles of respiration.  CARDIOVASCULAR: S1, S2 normal. No murmurs, rubs, or gallops.  ABDOMEN: Soft, nontender, nondistended. Bowel sounds present. No organomegaly or mass.  EXTREMITIES: No pedal edema, cyanosis, or clubbing.  NEUROLOGIC: Cranial nerves II through XII are intact. Muscle strength 5/5 in all extremities. Sensation intact. Gait not checked.  PSYCHIATRIC: The patient is alert and oriented x 3.  SKIN: No obvious rash, lesion, or ulcer.  DATA REVIEW:   CBC Recent Labs  Lab 04/17/18 0910  WBC 5.7  HGB 9.9*  HCT 32.3*  PLT 329    Chemistries  Recent Labs  Lab 04/17/18 0910  NA 138  K 3.8  CL 108  CO2 26  GLUCOSE 96  BUN 10  CREATININE 0.87  CALCIUM 8.9     Microbiology Results  No results found for this or any previous visit.  RADIOLOGY:  Nm Myocar Multi W/spect W/wall Motion / Ef  Result Date: 04/17/2018 Exercise myocardial perfusion imaging study with no significant  ischemia Normal wall motion, EF estimated at 58% No EKG changes concerning for ischemia at peak stress or in recovery. Low risk scan Signed, Vanessa Plants, MD, Ph.D [facility]     Management plans discussed with the patient, family and they are in agreement.  CODE STATUS:  Code Status History    Date Active Date Inactive Code Status Order ID Comments User Context   04/16/2018 2224 04/17/2018 1827 Full Code [id number]  Vanessa Basta, MD Inpatient      TOTAL TIME TAKING CARE OF THIS PATIENT: 38 minutes.    Vanessa Chaney M.D on 04/18/2018 at 11:38 AM  Between 7am to 6pm - Pager - [phone]  After 6pm go to [email or web address] - Proofreader  [facility]  Office  [phone]  CC: Primary care physician; Patient, No Pcp Per   Note: This dictation was prepared with Dragon dictation along with smaller phrase technology. Any transcriptional errors that result from this process are  unintentional.

## 2018-05-08 ENCOUNTER — Encounter: Payer: Self-pay | Admitting: Physician Assistant

## 2018-05-08 NOTE — Progress Notes (Signed)
Cardiology Office Note Date:  05/11/2018  Patient ID:  Vanessa Chaney, Vanessa Chaney 02-Jan-1986, MRN [id number] PCP:  Patient, No Pcp Per  Cardiologist:  Dr. Rockey Situ, MD    Chief Complaint: Hospital follow up  History of Present Illness: [name] is a 33 y.o. female with history of anemia and headaches who presents for hospital follow up of recent admission to [facility] from 04/16/2018 through 04/17/2018 for left arm pain and abnormal EKG.   She has no prior cardiac history. She was sent to [facility] from a local urgent care in 04/2018 for left arm pain and an abnormal EKG with TWI in the anterior leads. Cardiac enzymes remained negative. She remained symptom free during her admission. She underwent nuclear stress test on 04/17/2018 that showed no significant ischemia, EF 58%, no EKG changes concerning for ischemia and was an overall low risk scan.   Labs: K+ 3.8, Scr 0.87, WBC 5.7, HGB 9.9 (baseline 9-10), PLT 329, LDL 85, A1c 5.5  Patient comes in doing well today.  She reports on 04/16/2018 she had an episode of left arm pain that lasted for a couple of hours and improved with aspirin.  On 04/17/2018 she again had an episode of left arm pain and presented to an outside urgent care where she was given aspirin and nitroglycerin with improvement in pain.  Both of these episodes occurred at rest.  Since her hospital discharge, she has had a rare, fleeting episode of left arm pain that occurs at rest and typically lasts 1 to 2 seconds with spontaneous resolution.  No shortness of breath, palpitations, dizziness, presyncope, or syncope.  She denies any family history of premature cardiac disease.  There is no family history of sudden cardiac death.  The patient lives a relatively active lifestyle and has 3 children.  She has never had any dizziness or syncope.  She does not smoke or use illegal drugs.  She drinks socially with friends.  She has cut back her caffeine from 3 sodas a day to 1 soda per day.   Past  Medical History:  Diagnosis Date  . Allergy   . Anemia   . Chicken pox   . Frequent headaches   . Genital herpes 12/2014   pos HSV culture    Past Surgical History:  Procedure Laterality Date  . CESAREAN SECTION  2005,2009,2013  . TUBAL LIGATION  2013    No outpatient medications have been marked as taking for the 05/11/18 encounter (Office Visit) with Rise Mu, PA-C.    Allergies:   Latex and Shrimp [shellfish allergy]   Social History:  The patient  reports that she has never smoked. She has never used smokeless tobacco. She reports current alcohol use. She reports that she does not use drugs.   Family History:  The patient's family history includes Hypertension in her mother; Ovarian cancer in an other family member; Prostate cancer in her father; Uterine cancer (age of onset: 34) in her paternal aunt.  ROS:   Review of Systems  Constitutional: Negative for chills, diaphoresis, fever, malaise/fatigue and weight loss.  HENT: Negative for congestion.   Eyes: Negative for discharge and redness.  Respiratory: Negative for cough, hemoptysis, sputum production, shortness of breath and wheezing.   Cardiovascular: Negative for chest pain, palpitations, orthopnea, claudication, leg swelling and PND.  Gastrointestinal: Negative for abdominal pain, blood in stool, heartburn, melena, nausea and vomiting.  Genitourinary: Negative for hematuria.  Musculoskeletal: Positive for joint pain. Negative for falls and  myalgias.       Left arm pain  Skin: Negative for rash.  Neurological: Negative for dizziness, tingling, tremors, sensory change, speech change, focal weakness, loss of consciousness and weakness.  Endo/Heme/Allergies: Does not bruise/bleed easily.  Psychiatric/Behavioral: Negative for substance abuse. The patient is not nervous/anxious.   All other systems reviewed and are negative.    PHYSICAL EXAM:  VS:  BP 116/80 (BP Location: Left Arm, Patient Position: Sitting, Cuff  Size: Normal)   Pulse 61   Ht 5\' 2"  (1.575 m)   Wt 166 lb 8 oz (75.5 kg)   BMI 30.45 kg/m  BMI: Body mass index is 30.45 kg/m.  Physical Exam  Constitutional: She is oriented to person, place, and time. She appears well-developed and well-nourished.  HENT:  Head: Normocephalic and atraumatic.  Eyes: Right eye exhibits no discharge. Left eye exhibits no discharge.  Neck: Normal range of motion. No JVD present.  Cardiovascular: Normal rate, regular rhythm, S1 normal, S2 normal and normal heart sounds. Exam reveals no distant heart sounds, no friction rub, no midsystolic click and no opening snap.  No murmur heard. Pulses:      Posterior tibial pulses are 2+ on the right side and 2+ on the left side.  Pulmonary/Chest: Effort normal and breath sounds normal. No respiratory distress. She has no decreased breath sounds. She has no wheezes. She has no rales. She exhibits no tenderness.  Abdominal: Soft. She exhibits no distension. There is no abdominal tenderness.  Musculoskeletal:        General: No edema.  Neurological: She is alert and oriented to person, place, and time.  Skin: Skin is warm and dry. No cyanosis. Nails show no clubbing.  Psychiatric: She has a normal mood and affect. Her speech is normal and behavior is normal. Judgment and thought content normal.     EKG:  Was ordered and interpreted by me today. Shows NSR, 61 bpm, normal axis, nonspecific improved anterior ST-T changes  Recent Labs: 04/17/2018: BUN 10; Creatinine, Ser 0.87; Hemoglobin 9.9; Platelets 329; Potassium 3.8; Sodium 138  04/16/2018: Cholesterol 163; HDL 66; LDL Cholesterol 85; Total CHOL/HDL Ratio 2.5; Triglycerides 61; VLDL 12   CrCl cannot be calculated (Patient's most recent lab result is older than the maximum 21 days allowed.).   Wt Readings from Last 3 Encounters:  05/11/18 166 lb 8 oz (75.5 kg)  04/16/18 165 lb (74.8 kg)  06/04/16 156 lb (70.8 kg)     Other studies reviewed: Additional  studies/records reviewed today include: summarized above  ASSESSMENT AND PLAN:  1. Abnormal EKG: EKG is improved though still abnormal.  Recent low risk ischemic evaluation.  Check echocardiogram as outlined below.  If this is unrevealing, she may need further cardiac imaging with a cardiac MRI or cardiac CTA.  2. Chest/left arm pain: Currently symptom-free.  Ischemic evaluation low risk as above.  Symptoms fairly atypical.  Check echocardiogram to evaluate for structural cardiac anomaly as outlined above.  If this is unrevealing consider further noninvasive cardiac imaging with cardiac MRI or CTA of the heart.  3. Anemia: Hemoglobin appears low though stable.  Follow-up with PCP.  Disposition: F/u with Dr. Rockey Situ or an APP in 1 month.  Current medicines are reviewed at length with the patient today.  The patient did not have any concerns regarding medicines.  Signed, Christell Faith, PA-C 05/11/2018 11:03 AM     [facility] [address] [place], [place] [address] [phone]

## 2018-05-11 ENCOUNTER — Encounter: Payer: Self-pay | Admitting: Physician Assistant

## 2018-05-11 ENCOUNTER — Ambulatory Visit (INDEPENDENT_AMBULATORY_CARE_PROVIDER_SITE_OTHER): Payer: Managed Care, Other (non HMO) | Admitting: Physician Assistant

## 2018-05-11 VITALS — BP 116/80 | HR 61 | Ht 62.0 in | Wt 166.5 lb

## 2018-05-11 DIAGNOSIS — R9431 Abnormal electrocardiogram [ECG] [EKG]: Secondary | ICD-10-CM | POA: Diagnosis not present

## 2018-05-11 DIAGNOSIS — M79602 Pain in left arm: Secondary | ICD-10-CM

## 2018-05-11 DIAGNOSIS — R072 Precordial pain: Secondary | ICD-10-CM | POA: Diagnosis not present

## 2018-05-11 DIAGNOSIS — D649 Anemia, unspecified: Secondary | ICD-10-CM | POA: Diagnosis not present

## 2018-05-11 NOTE — Patient Instructions (Signed)
Medication Instructions:  No changes If you need a refill on your cardiac medications before your next appointment, please call your pharmacy.   Lab work: None ordered  Testing/Procedures: Your physician has requested that you have an echocardiogram. Echocardiography is a painless test that uses sound waves to create images of your heart. It provides your doctor with information about the size and shape of your heart and how well your heart's chambers and valves are working. You may receive an ultrasound enhancing agent through an IV if needed to better visualize your heart during the echo.This procedure takes approximately one hour. There are no restrictions for this procedure. This will take place at the [facility] clinic.    Follow-Up: At [facility], you and your health needs are our priority.  As part of our continuing mission to provide you with exceptional heart care, we have created designated Provider Care Teams.  These Care Teams include your primary Cardiologist (physician) and Advanced Practice Providers (APPs -  Physician Assistants and Nurse Practitioners) who all work together to provide you with the care you need, when you need it. You will need a follow up appointment in 1 month. You may see Dr. Rockey Situ or one of the following Advanced Practice Providers on your designated Care Team:   Murray Hodgkins, NP Christell Faith, PA-C . Marrianne Mood, PA-C

## 2018-05-20 ENCOUNTER — Other Ambulatory Visit: Payer: Managed Care, Other (non HMO)

## 2018-06-03 ENCOUNTER — Other Ambulatory Visit: Payer: Managed Care, Other (non HMO)

## 2018-06-09 ENCOUNTER — Other Ambulatory Visit: Payer: Managed Care, Other (non HMO)

## 2018-06-16 ENCOUNTER — Ambulatory Visit: Payer: Managed Care, Other (non HMO) | Admitting: Cardiovascular Disease

## 2018-06-19 ENCOUNTER — Telehealth: Payer: Self-pay | Admitting: Physician Assistant

## 2018-06-19 NOTE — Telephone Encounter (Signed)
Patient scheduled for:  Echo- 3/24 [facility]) Ignacia Bayley, NP- 4/3     Cardiac Questionnaire:    Since your last visit or hospitalization:    1. Have you been having new or worsening chest pain? No   2. Have you been having new or worsening shortness of breath? No 3. Have you been having new or worsening leg swelling, wt gain, or increase in abdominal girth (pants fitting more tightly)? No   4. Have you had any passing out spells? No      Primary Cardiologist:  No primary care provider on file.   Patient contacted.  History reviewed.  No symptoms to suggest any unstable cardiac conditions.  Based on discussion, with current pandemic situation, we will be postponing this appointment for Ms. Netz.  If symptoms change, she has been instructed to contact our office.   Routing to C19 CANCEL pool for tracking (P CV DIV CV19 CANCEL) and assigning priority (1 = 4-6 wks, 2 = 6-12 wks, 3 = >12 wks).  Alvis Lemmings, RN  06/19/2018 10:58 AM         .

## 2018-06-23 ENCOUNTER — Other Ambulatory Visit: Payer: Managed Care, Other (non HMO)

## 2018-07-01 NOTE — Telephone Encounter (Signed)
Rescheduled 12 wks out in June

## 2018-07-03 ENCOUNTER — Ambulatory Visit: Payer: Managed Care, Other (non HMO) | Admitting: Nurse Practitioner

## 2018-07-09 NOTE — Telephone Encounter (Signed)
Patient scheduled for an in office appointment on 09/15/2018.

## 2018-08-20 ENCOUNTER — Ambulatory Visit
Admission: EM | Admit: 2018-08-20 | Discharge: 2018-08-20 | Disposition: A | Discharge status: Home / Self Care | Payer: 59 | Attending: Family Medicine | Admitting: Family Medicine

## 2018-08-20 ENCOUNTER — Encounter: Payer: Self-pay | Admitting: Emergency Medicine

## 2018-08-20 ENCOUNTER — Other Ambulatory Visit: Payer: Self-pay

## 2018-08-20 DIAGNOSIS — I10 Essential (primary) hypertension: Secondary | ICD-10-CM

## 2018-08-20 DIAGNOSIS — G44209 Tension-type headache, unspecified, not intractable: Secondary | ICD-10-CM

## 2018-08-20 MED ORDER — BUTALBITAL-APAP-CAFFEINE 50-325-40 MG PO TABS: 1.0000 | ORAL_TABLET | Freq: Four times a day (QID) | ORAL | 0 refills | Status: AC | PRN

## 2018-08-20 MED ORDER — AMLODIPINE BESYLATE 5 MG PO TABS: 5.0000 mg | ORAL_TABLET | Freq: Every day | ORAL | 1 refills | Status: DC

## 2018-08-20 MED ORDER — PROMETHAZINE HCL 25 MG/ML IJ SOLN
25.0000 mg | Freq: Once | INTRAMUSCULAR | Status: AC
  Administered 2018-08-20: 17:00:00 25 mg via INTRAMUSCULAR

## 2018-08-20 MED ORDER — KETOROLAC TROMETHAMINE 60 MG/2ML IM SOLN
60.0000 mg | Freq: Once | INTRAMUSCULAR | Status: AC
  Administered 2018-08-20: 60 mg via INTRAMUSCULAR

## 2018-08-20 NOTE — ED Provider Notes (Signed)
[facility]    CSN: [id number] Arrival date & time: 08/20/18  1552  History   Chief Complaint Chief Complaint  Patient presents with  . Headache   HPI  33 year old female presents with headache.  Patient reports a 2-day history of headache.  Located in the frontal and occipital regions.  Severe per her report.  She states she has had a history of similar headache which eventually resolved with over-the-counter medication.  Associated nausea.  No photophobia.  Patient has noted that her blood pressure has been elevated.  Her blood pressure is currently 165/104.  Has a history of hypertension that has not been treated.  Patient has taken Excedrin without relief.  No known exacerbating factors.  No reports of vision changes.  No other associated symptoms.  No other complaints.  PMH, Surgical Hx, Family Hx, Social History reviewed and updated as below.  Past Medical History:  Diagnosis Date  . Allergy   . Anemia   . Chicken pox   . Frequent headaches   . Genital herpes 12/2014   pos HSV culture   Patient Active Problem List   Diagnosis Date Noted  . Chest pain 04/16/2018  . Chlamydia 06/07/2016  . BV (bacterial vaginosis) 06/04/2016  . Encounter for preventative adult health care exam with abnormal findings 09/13/2015  . Elevated BP 09/13/2015  . Other fatigue 09/13/2015  . Headache 09/13/2015   Past Surgical History:  Procedure Laterality Date  . CESAREAN SECTION  2005,2009,2013  . TUBAL LIGATION  2013    OB History    Gravida  4   Para  3   Term      Preterm      AB  1   Living        SAB      TAB      Ectopic      Multiple      Live Births  3          Home Medications    Prior to Admission medications   Medication Sig Start Date End Date Taking? Authorizing Provider  amLODipine (NORVASC) 5 MG tablet Take 1 tablet (5 mg total) by mouth daily. 08/20/18   Coral Spikes, DO  butalbital-acetaminophen-caffeine (FIORICET) [phone] MG  tablet Take 1 tablet by mouth every 6 (six) hours as needed for headache. 08/20/18 08/20/19  Coral Spikes, DO    Family History Family History  Problem Relation Age of Onset  . Hypertension Mother   . Prostate cancer Father   . Uterine cancer Paternal Aunt 24  . Ovarian cancer Other     Social History Social History   Tobacco Use  . Smoking status: Never Smoker  . Smokeless tobacco: Never Used  Substance Use Topics  . Alcohol use: Yes    Alcohol/week: 0.0 - 1.0 standard drinks  . Drug use: No     Allergies   Latex and Shrimp [shellfish allergy]   Review of Systems Review of Systems  Eyes: Negative.   Gastrointestinal: Positive for nausea.  Neurological: Positive for headaches.   Physical Exam Triage Vital Signs ED Triage Vitals  Enc Vitals Group     BP 08/20/18 1607 (!) 165/104     Pulse Rate 08/20/18 1607 70     Resp 08/20/18 1607 18     Temp 08/20/18 1607 98.4 F (36.9 C)     Temp Source 08/20/18 1607 Oral     SpO2 08/20/18 1607 100 %  Weight 08/20/18 1605 165 lb (74.8 kg)     Height 08/20/18 1605 5\' 3"  (1.6 m)     Head Circumference --      Peak Flow --      Pain Score 08/20/18 1605 10     Pain Loc --      Pain Edu? --      Excl. in [place]? --    Updated Vital Signs BP (!) 165/104 (BP Location: Left Arm)   Pulse 70   Temp 98.4 F (36.9 C) (Oral)   Resp 18   Ht 5\' 3"  (1.6 m)   Wt 74.8 kg   LMP 08/13/2018 (Approximate)   SpO2 100%   BMI 29.23 kg/m   Visual Acuity Right Eye Distance:   Left Eye Distance:   Bilateral Distance:    Right Eye Near:   Left Eye Near:    Bilateral Near:     Physical Exam Vitals signs and nursing note reviewed.  Constitutional:      General: She is not in acute distress.    Appearance: Normal appearance.  HENT:     Head: Normocephalic and atraumatic.     Mouth/Throat:     Pharynx: Oropharynx is clear. No posterior oropharyngeal erythema.  Eyes:     General:        Right eye: No discharge.        Left eye:  No discharge.     Conjunctiva/sclera: Conjunctivae normal.     Pupils: Pupils are equal, round, and reactive to light.  Cardiovascular:     Rate and Rhythm: Normal rate and regular rhythm.  Pulmonary:     Effort: Pulmonary effort is normal.     Breath sounds: Normal breath sounds.  Neurological:     Mental Status: She is alert.  Psychiatric:        Mood and Affect: Mood normal.        Behavior: Behavior normal.    UC Treatments / Results  Labs (all labs ordered are listed, but only abnormal results are displayed) Labs Reviewed - No data to display  EKG None  Radiology No results found.  Procedures Procedures (including critical care time)  Medications Ordered in UC Medications  ketorolac (TORADOL) injection 60 mg (60 mg Intramuscular Given 08/20/18 1638)  promethazine (PHENERGAN) injection 25 mg (25 mg Intramuscular Given 08/20/18 1638)    Initial Impression / Assessment and Plan / UC Course  I have reviewed the triage vital signs and the nursing notes.  Pertinent labs & imaging results that were available during my care of the patient were reviewed by me and considered in my medical decision making (see chart for details).    33 year old female presents with tension headache.  Placing on Fioricet.  Patient also has essential hypertension.  Untreated.  Placing on amlodipine.  Final Clinical Impressions(s) / UC Diagnoses   Final diagnoses:  Tension headache  Essential hypertension     Discharge Instructions     Medication as prescribed.  Take care  Dr. Lacinda Axon    ED Prescriptions    Medication Sig Dispense Auth. Provider   amLODipine (NORVASC) 5 MG tablet Take 1 tablet (5 mg total) by mouth daily. 90 tablet Lauretta Sallas G, DO   butalbital-acetaminophen-caffeine (FIORICET) 50-325-40 MG tablet Take 1 tablet by mouth every 6 (six) hours as needed for headache. 20 tablet Coral Spikes, DO     Controlled Substance Prescriptions [place] Controlled Substance Registry  consulted? Not Applicable   Coral Spikes,  DO 08/20/18 1800

## 2018-08-20 NOTE — ED Triage Notes (Signed)
Pt c/o headache and nausea. Started about 2 days ago but has gotten worse today. She states that she has a BP cuff at home and has been running 140's/101. Pain is located above the eyebrows and in the back of her head.

## 2018-08-20 NOTE — Discharge Instructions (Signed)
Medication as prescribed.  Take care  Dr. Abby Tucholski  

## 2018-09-15 ENCOUNTER — Ambulatory Visit (INDEPENDENT_AMBULATORY_CARE_PROVIDER_SITE_OTHER): Payer: 59 | Admitting: Cardiovascular Disease

## 2018-09-15 ENCOUNTER — Encounter: Payer: Self-pay | Admitting: Cardiovascular Disease

## 2018-09-15 ENCOUNTER — Ambulatory Visit (INDEPENDENT_AMBULATORY_CARE_PROVIDER_SITE_OTHER): Payer: 59

## 2018-09-15 ENCOUNTER — Other Ambulatory Visit: Payer: Self-pay

## 2018-09-15 ENCOUNTER — Telehealth: Payer: Self-pay

## 2018-09-15 VITALS — BP 134/62 | HR 56 | Ht 63.0 in | Wt 164.0 lb

## 2018-09-15 DIAGNOSIS — R9431 Abnormal electrocardiogram [ECG] [EKG]: Secondary | ICD-10-CM | POA: Diagnosis not present

## 2018-09-15 DIAGNOSIS — R519 Headache, unspecified: Secondary | ICD-10-CM

## 2018-09-15 DIAGNOSIS — M79602 Pain in left arm: Secondary | ICD-10-CM

## 2018-09-15 DIAGNOSIS — R072 Precordial pain: Secondary | ICD-10-CM | POA: Diagnosis not present

## 2018-09-15 DIAGNOSIS — I10 Essential (primary) hypertension: Secondary | ICD-10-CM | POA: Diagnosis not present

## 2018-09-15 DIAGNOSIS — R51 Headache: Secondary | ICD-10-CM

## 2018-09-15 NOTE — Telephone Encounter (Signed)

## 2018-09-15 NOTE — Patient Instructions (Signed)
Monitor blood pressure See if we can wean down on the medication  Dr. Rock Nephew NP: Dr. Delanna Notice [place]  Local: Dr. Manuella Ghazi   Medication Instructions:  No changes  If you need a refill on your cardiac medications before your next appointment, please call your pharmacy.    Lab work: No new labs needed   If you have labs (blood work) drawn today and your tests are completely normal, you will receive your results only by: Marland Kitchen MyChart Message (if you have MyChart) OR . A paper copy in the mail If you have any lab test that is abnormal or we need to change your treatment, we will call you to review the results.   Testing/Procedures: No new testing needed   Follow-Up: At [facility], you and your health needs are our priority.  As part of our continuing mission to provide you with exceptional heart care, we have created designated Provider Care Teams.  These Care Teams include your primary Cardiologist (physician) and Advanced Practice Providers (APPs -  Physician Assistants and Nurse Practitioners) who all work together to provide you with the care you need, when you need it.  . You will need a follow up appointment as needed  . Providers on your designated Care Team:   . Murray Hodgkins, NP . Christell Faith, PA-C . Marrianne Mood, PA-C  Any Other Special Instructions Will Be Listed Below (If Applicable).  For educational health videos Log in to : [email or web address] Or : [email or web address], password : [email or web address]

## 2018-09-15 NOTE — Progress Notes (Signed)
Cardiology Office Note  Date:  09/15/2018   ID:  MC HOLLEN, DOB [birth year], MRN [id number]  PCP:  Patient, No Pcp Per  Followed by OB/GYN  Chief Complaint  Patient presents with  . other    Patient denies chest pain and SOB at this time. Meds reviewed verbally with patient.    HPI:  Vanessa Chaney is a 33 y.o. female with history of  anemia  Headaches/Migraines admission to [facility] from 04/16/2018 through 04/17/2018 for left arm pain and abnormal EKG.  Who presents today for discussion of her echocardiogram  In general she reports that she has been doing well Denies any significant shortness of breath or chest pain  Sent to [facility] from a local urgent care in 04/2018 for left arm pain and an abnormal EKG with TWI in the anterior leads. Cardiac enzymes remained negative.    nuclear stress test on 04/17/2018 that showed no significant ischemia, EF 58%, no EKG changes concerning for ischemia and was an overall low risk scan.   Follow-up echocardiogram discussed with her today, images pulled up in the office Normal ejection fraction, no valve disease  Labs:  LDL 85, A1c 5.5  relatively active lifestyle and has 3 children.    Continues to have trouble with headaches, was started on Fioricet Also started on amlodipine 5 mg daily by Dr. Lacinda Axon in acute care She does not check her blood pressure much at home She has been having dizziness sitting up in bed in the morning  PMH:   has a past medical history of Allergy, Anemia, Chicken pox, Frequent headaches, and Genital herpes (12/2014).  PSH:    Past Surgical History:  Procedure Laterality Date  . CESAREAN SECTION  2005,2009,2013  . TUBAL LIGATION  2013    Current Outpatient Medications  Medication Sig Dispense Refill  . amLODipine (NORVASC) 5 MG tablet Take 1 tablet (5 mg total) by mouth daily. 90 tablet 1  . butalbital-acetaminophen-caffeine (FIORICET) 50-325-40 MG tablet Take 1 tablet by mouth every 6 (six) hours as needed  for headache. 20 tablet 0   No current facility-administered medications for this visit.      Allergies:   Latex and Shrimp [shellfish allergy]   Social History:  The patient  reports that she has never smoked. She has never used smokeless tobacco. She reports current alcohol use. She reports that she does not use drugs.   Family History:   family history includes Hypertension in her mother; Ovarian cancer in an other family member; Prostate cancer in her father; Uterine cancer (age of onset: 56) in her paternal aunt.    Review of Systems: Review of Systems  Constitutional: Negative.   Respiratory: Negative.   Cardiovascular: Negative.   Gastrointestinal: Negative.   Musculoskeletal: Negative.   Neurological: Positive for headaches.  Psychiatric/Behavioral: Negative.   All other systems reviewed and are negative.   PHYSICAL EXAM: VS:  BP 134/62 (BP Location: Left Arm, Patient Position: Sitting, Cuff Size: Normal)   Pulse (!) 56   Ht 5\' 3"  (1.6 m)   Wt 164 lb (74.4 kg)   SpO2 99%   BMI 29.05 kg/m  , BMI Body mass index is 29.05 kg/m. GEN: Well nourished, well developed, in no acute distress  HEENT: normal  Neck: no JVD, carotid bruits, or masses Cardiac: RRR; no murmurs, rubs, or gallops,no edema  Respiratory:  clear to auscultation bilaterally, normal work of breathing GI: soft, nontender, nondistended, + BS MS: no deformity or atrophy  Skin:  warm and dry, no rash Neuro:  Strength and sensation are intact Psych: euthymic mood, full affect  Recent Labs: 04/17/2018: BUN 10; Creatinine, Ser 0.87; Hemoglobin 9.9; Platelets 329; Potassium 3.8; Sodium 138    Lipid Panel Lab Results  Component Value Date   CHOL 163 04/16/2018   HDL 66 04/16/2018   LDLCALC 85 04/16/2018   TRIG 61 04/16/2018      Wt Readings from Last 3 Encounters:  09/15/18 164 lb (74.4 kg)  08/20/18 165 lb (74.8 kg)  05/11/18 166 lb 8 oz (75.5 kg)    ASSESSMENT AND PLAN:  Precordial pain -   Atypical pain, negative stress test Normal ECHyo  Left arm pain -  Atypical discomfort No more workup needed  Benign essential HTN -  Monitor BP at home May be able to wean off norvasc if numbers are controlled Wait loss  Acute nonintractable headache, unspecified headache type - Referral to neurology, names provided   Total encounter time more than 25 minutes  Greater than 50% was spent in counseling and coordination of care with the patient   Disposition:   F/U as needed  No orders of the defined types were placed in this encounter.    Signed, Esmond Plants, M.D., Ph.D. 09/15/2018  [place], [place]

## 2018-09-15 NOTE — Telephone Encounter (Signed)
Called patient to ask COVID SCREENING questions.  No answer. LMOV.

## 2018-10-21 DIAGNOSIS — M7918 Myalgia, other site: Secondary | ICD-10-CM | POA: Diagnosis not present

## 2018-10-21 DIAGNOSIS — R0683 Snoring: Secondary | ICD-10-CM | POA: Diagnosis not present

## 2018-10-21 DIAGNOSIS — G43019 Migraine without aura, intractable, without status migrainosus: Secondary | ICD-10-CM | POA: Diagnosis not present

## 2018-12-16 DIAGNOSIS — R0683 Snoring: Secondary | ICD-10-CM | POA: Diagnosis not present

## 2019-01-06 DIAGNOSIS — Z23 Encounter for immunization: Secondary | ICD-10-CM | POA: Diagnosis not present

## 2019-02-22 DIAGNOSIS — G43019 Migraine without aura, intractable, without status migrainosus: Secondary | ICD-10-CM | POA: Diagnosis not present

## 2019-02-22 DIAGNOSIS — Z20828 Contact with and (suspected) exposure to other viral communicable diseases: Secondary | ICD-10-CM | POA: Diagnosis not present

## 2019-03-08 DIAGNOSIS — H9201 Otalgia, right ear: Secondary | ICD-10-CM | POA: Diagnosis not present

## 2019-03-12 DIAGNOSIS — Z20828 Contact with and (suspected) exposure to other viral communicable diseases: Secondary | ICD-10-CM | POA: Diagnosis not present

## 2019-06-18 ENCOUNTER — Ambulatory Visit: Payer: 59 | Attending: Internal Medicine

## 2019-06-18 DIAGNOSIS — Z23 Encounter for immunization: Secondary | ICD-10-CM

## 2019-06-18 NOTE — Progress Notes (Signed)
   Covid-19 Vaccination Clinic  Name:  Vanessa Chaney    MRN: [id number] DOB: 1985-04-24  06/18/2019  [name] was observed post Covid-19 immunization for 15 minutes without incident. She was provided with Vaccine Information Sheet and instruction to access the V-Safe system.   [name] was instructed to call 911 with any severe reactions post vaccine: Marland Kitchen Difficulty breathing  . Swelling of face and throat  . A fast heartbeat  . A bad rash all over body  . Dizziness and weakness   Immunizations Administered    Name Date Dose VIS Date Route   Pfizer COVID-19 Vaccine 06/18/2019 11:01 AM 0.3 mL 03/12/2019 Intramuscular   Manufacturer: [place]   Lot: [id number]   [place]: [id number]

## 2019-06-28 DIAGNOSIS — G43019 Migraine without aura, intractable, without status migrainosus: Secondary | ICD-10-CM | POA: Diagnosis not present

## 2019-07-08 ENCOUNTER — Other Ambulatory Visit: Payer: Self-pay | Admitting: Neurology

## 2019-07-08 DIAGNOSIS — G43019 Migraine without aura, intractable, without status migrainosus: Secondary | ICD-10-CM

## 2019-07-14 ENCOUNTER — Ambulatory Visit: Payer: 59 | Attending: Internal Medicine

## 2019-07-14 DIAGNOSIS — Z23 Encounter for immunization: Secondary | ICD-10-CM

## 2019-07-14 NOTE — Progress Notes (Signed)
   Covid-19 Vaccination Clinic  Name:  Vanessa Chaney    MRN: [id number] DOB: 01/29/1986  07/14/2019  [name] was observed post Covid-19 immunization for 15 minutes without incident. She was provided with Vaccine Information Sheet and instruction to access the V-Safe system.   [name] was instructed to call 911 with any severe reactions post vaccine: Marland Kitchen Difficulty breathing  . Swelling of face and throat  . A fast heartbeat  . A bad rash all over body  . Dizziness and weakness   Immunizations Administered    Name Date Dose VIS Date Route   Pfizer COVID-19 Vaccine 07/14/2019 11:06 AM 0.3 mL 03/12/2019 Intramuscular   Manufacturer: [place]   Lot: [id number]   [place]: [id number]

## 2019-07-16 ENCOUNTER — Other Ambulatory Visit: Payer: Self-pay

## 2019-07-16 ENCOUNTER — Ambulatory Visit
Admission: RE | Admit: 2019-07-16 | Discharge: 2019-07-16 | Disposition: A | Discharge status: Home / Self Care | Payer: 59 | Source: Ambulatory Visit | Attending: Neurology | Admitting: Neurology

## 2019-07-16 DIAGNOSIS — G43709 Chronic migraine without aura, not intractable, without status migrainosus: Secondary | ICD-10-CM | POA: Diagnosis not present

## 2019-07-16 DIAGNOSIS — G43019 Migraine without aura, intractable, without status migrainosus: Secondary | ICD-10-CM | POA: Insufficient documentation

## 2019-07-22 DIAGNOSIS — Z01419 Encounter for gynecological examination (general) (routine) without abnormal findings: Secondary | ICD-10-CM | POA: Diagnosis not present

## 2019-07-22 DIAGNOSIS — G43019 Migraine without aura, intractable, without status migrainosus: Secondary | ICD-10-CM | POA: Diagnosis not present

## 2019-07-22 DIAGNOSIS — N920 Excessive and frequent menstruation with regular cycle: Secondary | ICD-10-CM | POA: Diagnosis not present

## 2019-07-26 DIAGNOSIS — S93602A Unspecified sprain of left foot, initial encounter: Secondary | ICD-10-CM | POA: Diagnosis not present

## 2019-09-09 DIAGNOSIS — T7840XA Allergy, unspecified, initial encounter: Secondary | ICD-10-CM | POA: Diagnosis not present

## 2019-10-22 DIAGNOSIS — N920 Excessive and frequent menstruation with regular cycle: Secondary | ICD-10-CM | POA: Diagnosis not present

## 2019-10-22 DIAGNOSIS — N938 Other specified abnormal uterine and vaginal bleeding: Secondary | ICD-10-CM | POA: Diagnosis not present

## 2019-10-27 DIAGNOSIS — N938 Other specified abnormal uterine and vaginal bleeding: Secondary | ICD-10-CM | POA: Diagnosis not present

## 2019-10-27 DIAGNOSIS — N920 Excessive and frequent menstruation with regular cycle: Secondary | ICD-10-CM | POA: Diagnosis not present

## 2019-11-08 DIAGNOSIS — I10 Essential (primary) hypertension: Secondary | ICD-10-CM | POA: Diagnosis not present

## 2019-11-08 DIAGNOSIS — R0683 Snoring: Secondary | ICD-10-CM | POA: Diagnosis not present

## 2019-11-08 DIAGNOSIS — G43019 Migraine without aura, intractable, without status migrainosus: Secondary | ICD-10-CM | POA: Diagnosis not present

## 2019-12-08 DIAGNOSIS — R69 Illness, unspecified: Secondary | ICD-10-CM | POA: Diagnosis not present

## 2019-12-08 DIAGNOSIS — Z124 Encounter for screening for malignant neoplasm of cervix: Secondary | ICD-10-CM | POA: Diagnosis not present

## 2019-12-08 DIAGNOSIS — N920 Excessive and frequent menstruation with regular cycle: Secondary | ICD-10-CM | POA: Diagnosis not present

## 2019-12-08 DIAGNOSIS — Z01818 Encounter for other preprocedural examination: Secondary | ICD-10-CM | POA: Diagnosis not present

## 2019-12-08 DIAGNOSIS — N938 Other specified abnormal uterine and vaginal bleeding: Secondary | ICD-10-CM | POA: Diagnosis not present

## 2019-12-10 DIAGNOSIS — Z1152 Encounter for screening for COVID-19: Secondary | ICD-10-CM | POA: Diagnosis not present

## 2019-12-10 DIAGNOSIS — Z03818 Encounter for observation for suspected exposure to other biological agents ruled out: Secondary | ICD-10-CM | POA: Diagnosis not present

## 2020-01-15 NOTE — H&P (Addendum)
Chief Complaint:    Patient ID: Vanessa Chaney is a 34 y.o. female presenting with Pre-op Exam  HPI: Vanessa Chaney is an established patient with lifetime of menorrhagia. Following BTL, bleeding became heavier and bleeding lasted for extended period of time. Longest period was 24 days and only stopped with provera. Options including non-hormonal, hormonal, vascular, ablative, and surgical have been reviewed in detail.  She has requested a hysterectomy.    Workup:  Pap: 2018 neg/neg : 2021 neg/neg   EMBx: benign  TVUS: 10/27/19 Uterus anteverted10.5 x 5 x 7 cm EE74mm LO4 x 4 x 2.5cmwithSeptated Cyst 3 cm RO3 x 3 x 2cm Mid fibroid: 1 x 1 x 1 cm  NoFree Fluid Seen   Past Medical History:  has a past medical history of Anemia, Hypertension, and Migraines.  Past Surgical History:  has a past surgical history that includes Cesarean section (2005, 2009, 2013). Family History: family history includes Brain cancer in her paternal grandmother; Cancer in her paternal grandfather; Berenice Primas' disease in her maternal grandmother and mother; High blood pressure (Hypertension) in her mother; No Known Problems in her brother, brother, and sister; Ovarian cancer in an other family member; Prostate cancer in her father. Social History:  reports that she has never smoked. She has never used smokeless tobacco. She reports previous alcohol use. She reports previous drug use. OB/GYN History:          OB History    Gravida  5   Para  3   Term  3   Preterm      AB  2   Living  3     SAB      TAB  2   Ectopic      Molar      Multiple      Live Births             Allergies: is allergic to latex, natural rubber and shellfish containing products. Medications:  Current Outpatient Medications:  .  fremanezumab-vfrm (AJOVY AUTOINJECTOR) 225 mg/1.5 mL AtIn, Inject 225 mg subcutaneously every 28 (twenty-eight) days, Disp: 1 Syringe, Rfl: 11 .  ondansetron (ZOFRAN) 8  MG tablet, Take 1 tablet (8 mg total) by mouth every 8 (eight) hours as needed for Nausea, Disp: 30 tablet, Rfl: 0 .  promethazine (PHENERGAN) 12.5 MG tablet, Take 1 tablet (12.5 mg total) by mouth every 4 (four) hours as needed for Nausea, Disp: 30 tablet, Rfl: 3 .  propranoloL (INDERAL LA) 60 MG LA capsule, Take 2 capsules (120 mg total) by mouth once daily, Disp: 270 capsule, Rfl: 1 .  rimegepant (NURTEC ODT) 75 mg disintegrating tablet, Take 1 tablet (75 mg total) by mouth once daily as needed, Disp: 10 tablet, Rfl: 11 .  rizatriptan (MAXALT) 10 MG tablet, , Disp: , Rfl:  .  medroxyPROGESTERone (PROVERA) 10 MG tablet, Provera Taper 10 mg, Start with 2 tabs BID until bleeding stops then 2 tabs AM and 1 tab PM x 4 Days, 1 tab BID x 4 Days, 1 tab Daily x 4 Days, 1/2 tab daily x 4 Days then STOP. (Patient not taking: Reported on 12/08/2019  ), Disp: 60 tablet, Rfl: 1   Review of Systems: No SOB, no palpitations or chest pain, no new lower extremity edema, no nausea or vomiting or bowel or bladder complaints. See HPI for gyn specific ROS.   Exam:   Constitutional: BP 124/84   Ht 158.8 cm (5' 2.52")   Wt 78.8 kg (173 lb  12.8 oz)   LMP 12/06/2019   BMI 31.26 kg/m   WDWN female in NAD   HEENT: sclera clear, non-icteric, moist mucous membranes Endocrine:  no thyromegaly Respiratory: normal respiratory effort, CTABL    CV: no peripheral edema, RRR no MRG GI: soft , no mass, non-tender, no rebound tenderness  VI:FBPPHK stage 5 ,  External genitalia/skin: vulva /labia no lesions Lymphatic: no enlarged inguinal nodes bilaterally Urethra: no prolapse, no diverticulum, no caruncle Bladder: no tenderness to palpation, no cystocele Vagina: blood in vault, no lesions, normal apical support Cervix: no lesions, no cervical motion tenderness  Uterus: top-normal size shape and contour, non-tender,  mobile Adnexa: no masses bilaterally, non-tender   Skin: warm and well perfused, no rashes Neuro: alert, oriented x3,   Psych: appropriate mood and insight, judgement intact  Impression:   The primary encounter diagnosis was Preoperative evaluation to rule out surgical contraindication. Diagnoses of Menorrhagia with regular cycle, DUB (dysfunctional uterine bleeding), Screening for cervical cancer, and Screening for HPV (human papillomavirus) were also pertinent to this visit.  Plan:   Dysfunctional Uterine Bleeding  Her preoperative exam was completed.  She has been advised of alternatives to surgery and declines.  Planned procedure: Laparoscopic Hysterectomy, ovarian cystectomy, with removal of any remnants of her fallopian tubes.  The patient and I discussed the technical aspects of the procedure including the potential for risks and complications.These include but are not limited to the risk of infection requiring post-operative antibiotics or further procedures, and post-operative limitations of 6 weeks without intercourse or heavy lifting.  We discussed the risk of injury to adjacent organs including bladder, bowel, ureter, blood vessels or nerves, the need to convert to an open incision, possibleneed for blood transfusion andpostop complications such asthromboembolic or cardiopulmonary complications.  All of her questions and concerns were answered.  She will be scheduled to undergo this procedure in the near future.  ----- Larey Days, MD, [place] Attending Obstetrician and Gynecologist [facility], Department of [facility]

## 2020-01-18 ENCOUNTER — Other Ambulatory Visit: Payer: Self-pay | Admitting: Obstetrics & Gynecology

## 2020-01-18 DIAGNOSIS — N938 Other specified abnormal uterine and vaginal bleeding: Secondary | ICD-10-CM | POA: Diagnosis not present

## 2020-01-18 DIAGNOSIS — Z01818 Encounter for other preprocedural examination: Secondary | ICD-10-CM | POA: Diagnosis not present

## 2020-01-18 DIAGNOSIS — N920 Excessive and frequent menstruation with regular cycle: Secondary | ICD-10-CM | POA: Diagnosis not present

## 2020-01-24 ENCOUNTER — Other Ambulatory Visit: Payer: Self-pay

## 2020-01-24 ENCOUNTER — Encounter
Admission: RE | Admit: 2020-01-24 | Discharge: 2020-01-24 | Disposition: A | Discharge status: Home / Self Care | Payer: 59 | Source: Ambulatory Visit | Attending: Obstetrics & Gynecology | Admitting: Obstetrics & Gynecology

## 2020-01-24 HISTORY — DX: Family history of other specified conditions: Z84.89

## 2020-01-24 NOTE — Pre-Procedure Instructions (Signed)
[name], MD  Physician  Cardiology  Progress Notes  Signed  Encounter Date:  09/15/2018          Signed      Expand All Collapse All  Show:Clear all [x] Manual[x] Template[x] Copied  Added by: [x] [name], MD  [] Hover for details Cardiology Office Note  Date:  09/15/2018   ID:  Vanessa Chaney, DOB Mar 16, 1986, MRN [id number]  PCP:  Patient, No Pcp Per      Followed by OB/GYN      Chief Complaint  Patient presents with  . other    Patient denies chest pain and SOB at this time. Meds reviewed verbally with patient.    HPI:  Vanessa Chaney a 34 y.o.femalewith history of anemia  Headaches/Migraines admission to [facility] from 04/16/2018 through 04/17/2018 for left arm pain and abnormal EKG.  Who presents today for discussion of her echocardiogram  In general she reports that she has been doing well Denies any significant shortness of breath or chest pain  Sent to [facility] from a local urgent care in 04/2018 for left arm pain and an abnormal EKG with TWI in the anterior leads. Cardiac enzymes remained negative.    nuclear stress test on 04/17/2018 that showed no significant ischemia, EF 58%, no EKG changes concerning for ischemia and was an overall low risk scan.   Follow-up echocardiogram discussed with her today, images pulled up in the office Normal ejection fraction, no valve disease  Labs:  LDL 85, A1c 5.5  relatively active lifestyle and has 3 children.   Continues to have trouble with headaches, was started on Fioricet Also started on amlodipine 5 mg daily by Dr. Lacinda Axon in acute care She does not check her blood pressure much at home She has been having dizziness sitting up in bed in the morning  PMH:   has a past medical history of Allergy, Anemia, Chicken pox, Frequent headaches, and Genital herpes (12/2014).  PSH:    Past Surgical History:  Procedure Laterality Date  . CESAREAN SECTION  2005,2009,2013  . TUBAL LIGATION   2013          Current Outpatient Medications  Medication Sig Dispense Refill  . amLODipine (NORVASC) 5 MG tablet Take 1 tablet (5 mg total) by mouth daily. 90 tablet 1  . butalbital-acetaminophen-caffeine (FIORICET) 50-325-40 MG tablet Take 1 tablet by mouth every 6 (six) hours as needed for headache. 20 tablet 0   No current facility-administered medications for this visit.      Allergies:   Latex and Shrimp [shellfish allergy]   Social History:  The patient  reports that she has never smoked. She has never used smokeless tobacco. She reports current alcohol use. She reports that she does not use drugs.   Family History:   family history includes Hypertension in her mother; Ovarian cancer in an other family member; Prostate cancer in her father; Uterine cancer (age of onset: 62) in her paternal aunt.    Review of Systems: Review of Systems  Constitutional: Negative.   Respiratory: Negative.   Cardiovascular: Negative.   Gastrointestinal: Negative.   Musculoskeletal: Negative.   Neurological: Positive for headaches.  Psychiatric/Behavioral: Negative.   All other systems reviewed and are negative.   PHYSICAL EXAM: VS:  BP 134/62 (BP Location: Left Arm, Patient Position: Sitting, Cuff Size: Normal)   Pulse (!) 56   Ht 5\' 3"  (1.6 m)   Wt 164 lb (74.4 kg)   SpO2 99%   BMI 29.05 kg/m  ,  BMI Body mass index is 29.05 kg/m. GEN: Well nourished, well developed, in no acute distress  HEENT: normal  Neck: no JVD, carotid bruits, or masses Cardiac: RRR; no murmurs, rubs, or gallops,no edema  Respiratory:  clear to auscultation bilaterally, normal work of breathing GI: soft, nontender, nondistended, + BS MS: no deformity or atrophy  Skin: warm and dry, no rash Neuro:  Strength and sensation are intact Psych: euthymic mood, full affect  Recent Labs: 04/17/2018: BUN 10; Creatinine, Ser 0.87; Hemoglobin 9.9; Platelets 329; Potassium 3.8; Sodium 138    Lipid  Panel Recent Labs       Lab Results  Component Value Date   CHOL 163 04/16/2018   HDL 66 04/16/2018   LDLCALC 85 04/16/2018   TRIG 61 04/16/2018           Wt Readings from Last 3 Encounters:  09/15/18 164 lb (74.4 kg)  08/20/18 165 lb (74.8 kg)  05/11/18 166 lb 8 oz (75.5 kg)    ASSESSMENT AND PLAN:  Precordial pain -  Atypical pain, negative stress test Normal ECHyo  Left arm pain -  Atypical discomfort No more workup needed  Benign essential HTN -  Monitor BP at home May be able to wean off norvasc if numbers are controlled Wait loss  Acute nonintractable headache, unspecified headache type - Referral to neurology, names provided   Total encounter time more than 25 minutes  Greater than 50% was spent in counseling and coordination of care with the patient   Disposition:   F/U as needed  No orders of the defined types were placed in this encounter.    Signed, Esmond Plants, M.D., Ph.D. 09/15/2018  [place]        Electronically signed by [name], MD at 09/15/2018 5:22 PM  Office Visit on 09/15/2018   Office Visit on 09/15/2018     Detailed Report

## 2020-01-24 NOTE — Patient Instructions (Addendum)
Your procedure is scheduled on:01-28-20 FRIDAY Report to Day Surgery on the 2nd floor of the [place]. To find out your arrival time, please call [phone] between 1PM - 3PM on:01-27-20 THURSDAY  REMEMBER: Instructions that are not followed completely may result in serious medical risk, up to and including death; or upon the discretion of your surgeon and anesthesiologist your surgery may need to be rescheduled.  Do not eat food after midnight the night before surgery.  No gum chewing, lozengers or hard candies.  You may however, drink CLEAR liquids up to 2 hours before you are scheduled to arrive for your surgery. Do not drink anything within 2 hours of your scheduled arrival time.  Clear liquids include: - water  - apple juice without pulp - gatorade (not RED, PURPLE, OR BLUE) - black coffee or tea (Do NOT add milk or creamers to the coffee or tea) Do NOT drink anything that is not on this list.  TAKE THESE MEDICATIONS THE MORNING OF SURGERY WITH A SIP OF WATER:  -NONE   One week prior to surgery: Stop Anti-inflammatories (NSAIDS) such as Advil, Aleve, Ibuprofen, Motrin, Naproxen, Naprosyn and Aspirin based products such as Excedrin, Goodys Powder, BC Powder-OK TO TAKE TYLENOL IF NEEDED  Stop ANY OVER THE COUNTER supplements until after surgery. (You may continue taking multivitamin.)  No Alcohol for 24 hours before or after surgery.  No Smoking including e-cigarettes for 24 hours prior to surgery.  No chewable tobacco products for at least 6 hours prior to surgery.  No nicotine patches on the day of surgery.  Do not use any "recreational" drugs for at least a week prior to your surgery.  Please be advised that the combination of cocaine and anesthesia may have negative outcomes, up to and including death. If you test positive for cocaine, your surgery will be cancelled.  On the morning of surgery brush your teeth with toothpaste and water, you may rinse your mouth  with mouthwash if you wish. Do not swallow any toothpaste or mouthwash.  Do not wear jewelry, make-up, hairpins, clips or nail polish.  Do not wear lotions, powders, or perfumes.   Do not shave 48 hours prior to surgery.   Contact lenses, hearing aids and dentures may not be worn into surgery.  Do not bring valuables to the hospital. [facility] is not responsible for any missing/lost belongings or valuables.   Use CHG Soap as directed on instruction sheet.  Notify your doctor if there is any change in your medical condition (cold, fever, infection).  Wear comfortable clothing (specific to your surgery type) to the hospital.  Plan for stool softeners for home use; pain medications have a tendency to cause constipation. You can also help prevent constipation by eating foods high in fiber such as fruits and vegetables and drinking plenty of fluids as your diet allows.  After surgery, you can help prevent lung complications by doing breathing exercises.  Take deep breaths and cough every 1-2 hours. Your doctor may order a device called an Incentive Spirometer to help you take deep breaths. When coughing or sneezing, hold a pillow firmly against your incision with both hands. This is called "splinting." Doing this helps protect your incision. It also decreases belly discomfort.  If you are being admitted to the hospital overnight, leave your suitcase in the car. After surgery it may be brought to your room.  If you are being discharged the day of surgery, you will not be allowed to  drive home. You will need a responsible adult (18 years or older) to drive you home and stay with you that night.   If you are taking public transportation, you will need to have a responsible adult (18 years or older) with you. Please confirm with your physician that it is acceptable to use public transportation.   Please call the [place] Dept. at [phone] if you have any questions  about these instructions.  Visitation Policy:  Patients undergoing a surgery or procedure may have one family member or support person with them as long as that person is not COVID-19 positive or experiencing its symptoms.  That person may remain in the waiting area during the procedure.  Inpatient Visitation Update:   In an effort to ensure the safety of our team members and our patients, we are implementing a change to our visitation policy:  Effective Monday, Aug. 9, at 7 a.m., inpatients will be allowed one support person.  o The support person may change daily.  o The support person must pass our screening, gel in and out, and wear a mask at all times, including in the patient's room.  o Patients must also wear a mask when staff or their support person are in the room.  o Masking is required regardless of vaccination status.  Systemwide, no visitors 17 or younger.

## 2020-01-26 ENCOUNTER — Other Ambulatory Visit: Payer: Self-pay

## 2020-01-26 ENCOUNTER — Other Ambulatory Visit
Admission: RE | Admit: 2020-01-26 | Discharge: 2020-01-26 | Disposition: A | Discharge status: Home / Self Care | Payer: 59 | Source: Ambulatory Visit | Attending: Obstetrics & Gynecology | Admitting: Obstetrics & Gynecology

## 2020-01-26 DIAGNOSIS — Z20822 Contact with and (suspected) exposure to covid-19: Secondary | ICD-10-CM | POA: Diagnosis not present

## 2020-01-26 DIAGNOSIS — Z01812 Encounter for preprocedural laboratory examination: Secondary | ICD-10-CM | POA: Diagnosis not present

## 2020-01-26 LAB — CBC
HCT: 29.5 % — ABNORMAL LOW (ref 36.0–46.0)
Hemoglobin: 8.7 g/dL — ABNORMAL LOW (ref 12.0–15.0)
MCH: 20.9 pg — ABNORMAL LOW (ref 26.0–34.0)
MCHC: 29.5 g/dL — ABNORMAL LOW (ref 30.0–36.0)
MCV: 70.9 fL — ABNORMAL LOW (ref 80.0–100.0)
Platelets: 327 10*3/uL (ref 150–400)
RBC: 4.16 MIL/uL (ref 3.87–5.11)
RDW: 17.5 % — ABNORMAL HIGH (ref 11.5–15.5)
WBC: 7.1 10*3/uL (ref 4.0–10.5)
nRBC: 0 % (ref 0.0–0.2)

## 2020-01-26 LAB — BASIC METABOLIC PANEL
Anion gap: 9 (ref 5–15)
BUN: 10 mg/dL (ref 6–20)
CO2: 22 mmol/L (ref 22–32)
Calcium: 9 mg/dL (ref 8.9–10.3)
Chloride: 107 mmol/L (ref 98–111)
Creatinine, Ser: 0.73 mg/dL (ref 0.44–1.00)
GFR, Estimated: >60 mL/min (ref >60)
Glucose, Bld: 74 mg/dL (ref 70–99)
Potassium: 4 mmol/L (ref 3.5–5.1)
Sodium: 138 mmol/L (ref 135–145)

## 2020-01-26 LAB — SARS CORONAVIRUS 2 (TAT 6-24 HRS): SARS Coronavirus 2: NEGATIVE

## 2020-01-26 LAB — TYPE AND SCREEN
ABO/RH(D): A POS
Antibody Screen: NEGATIVE

## 2020-01-26 NOTE — Progress Notes (Signed)
  [facility] Perioperative Services: Pre-Admission/Anesthesia Testing  Abnormal Lab Notification   Date: 01/26/20  Name: Vanessa Chaney MRN:   [id number]  Re: Abnormal labs noted during PAT appointment   Provider(s) Notified: Ward, Honor Loh, MD Notification mode: Routed and/or faxed via [place] LAB VALUE(S): Lab Results  Component Value Date   HGB 8.7 (L) 01/26/2020   HCT 29.5 (L) 01/26/2020   MCV 70.9 (L) 01/26/2020   MCH 20.9 (L) 01/26/2020   Notes:  Patient is scheduled for a TLH/BSO on 01/28/2020. Type and screen performed today in PAT; patient is A (+) with a negative Ab screen.  This is a Community education officer; no formal response is required.  Honor Loh, MSN, APRN, FNP-C, CEN [facility]  Peri-operative Services Nurse Practitioner Phone: [phone] 01/26/20 1:38 PM

## 2020-01-28 ENCOUNTER — Encounter
Admission: RE | Disposition: A | Discharge status: Home / Self Care | Payer: Self-pay | Source: Home / Self Care | Attending: Obstetrics & Gynecology

## 2020-01-28 ENCOUNTER — Other Ambulatory Visit: Payer: Self-pay

## 2020-01-28 ENCOUNTER — Ambulatory Visit
Admission: RE | Admit: 2020-01-28 | Discharge: 2020-01-28 | Disposition: A | Discharge status: Home / Self Care | Payer: 59 | Attending: Obstetrics & Gynecology | Admitting: Obstetrics & Gynecology

## 2020-01-28 ENCOUNTER — Ambulatory Visit: Payer: 59 | Admitting: Certified Registered Nurse Anesthetist

## 2020-01-28 ENCOUNTER — Encounter: Payer: Self-pay | Admitting: Obstetrics & Gynecology

## 2020-01-28 DIAGNOSIS — N800 Endometriosis of uterus: Secondary | ICD-10-CM | POA: Insufficient documentation

## 2020-01-28 DIAGNOSIS — D649 Anemia, unspecified: Secondary | ICD-10-CM | POA: Insufficient documentation

## 2020-01-28 DIAGNOSIS — D282 Benign neoplasm of uterine tubes and ligaments: Secondary | ICD-10-CM | POA: Diagnosis not present

## 2020-01-28 DIAGNOSIS — N838 Other noninflammatory disorders of ovary, fallopian tube and broad ligament: Secondary | ICD-10-CM | POA: Insufficient documentation

## 2020-01-28 DIAGNOSIS — N920 Excessive and frequent menstruation with regular cycle: Secondary | ICD-10-CM | POA: Diagnosis not present

## 2020-01-28 DIAGNOSIS — D250 Submucous leiomyoma of uterus: Secondary | ICD-10-CM | POA: Diagnosis not present

## 2020-01-28 DIAGNOSIS — Z9851 Tubal ligation status: Secondary | ICD-10-CM | POA: Diagnosis not present

## 2020-01-28 DIAGNOSIS — D252 Subserosal leiomyoma of uterus: Secondary | ICD-10-CM | POA: Diagnosis not present

## 2020-01-28 DIAGNOSIS — N736 Female pelvic peritoneal adhesions (postinfective): Secondary | ICD-10-CM | POA: Diagnosis not present

## 2020-01-28 DIAGNOSIS — N83209 Unspecified ovarian cyst, unspecified side: Secondary | ICD-10-CM | POA: Diagnosis not present

## 2020-01-28 DIAGNOSIS — D270 Benign neoplasm of right ovary: Secondary | ICD-10-CM | POA: Diagnosis not present

## 2020-01-28 HISTORY — PX: TOTAL LAPAROSCOPIC HYSTERECTOMY WITH BILATERAL SALPINGO OOPHORECTOMY: SHX6845

## 2020-01-28 LAB — ABO/RH: ABO/RH(D): A POS

## 2020-01-28 LAB — CBC
HCT: 28.2 % — ABNORMAL LOW (ref 36.0–46.0)
Hemoglobin: 8.3 g/dL — ABNORMAL LOW (ref 12.0–15.0)
MCH: 20.7 pg — ABNORMAL LOW (ref 26.0–34.0)
MCHC: 29.4 g/dL — ABNORMAL LOW (ref 30.0–36.0)
MCV: 70.3 fL — ABNORMAL LOW (ref 80.0–100.0)
Platelets: 294 10*3/uL (ref 150–400)
RBC: 4.01 MIL/uL (ref 3.87–5.11)
RDW: 17.5 % — ABNORMAL HIGH (ref 11.5–15.5)
WBC: 7 10*3/uL (ref 4.0–10.5)
nRBC: 0 % (ref 0.0–0.2)

## 2020-01-28 LAB — POCT PREGNANCY, URINE: Preg Test, Ur: NEGATIVE

## 2020-01-28 SURGERY — HYSTERECTOMY, TOTAL, LAPAROSCOPIC, WITH BILATERAL SALPINGO-OOPHORECTOMY
Anesthesia: General | Laterality: Bilateral

## 2020-01-28 MED ORDER — IBUPROFEN 800 MG PO TABS: 800.0000 mg | ORAL_TABLET | Freq: Four times a day (QID) | ORAL | 0 refills | Status: AC

## 2020-01-28 MED ORDER — SCOPOLAMINE 1 MG/3DAYS TD PT72
MEDICATED_PATCH | TRANSDERMAL | Status: AC
  Filled 2020-01-28: qty 1

## 2020-01-28 MED ORDER — KETAMINE HCL 50 MG/ML IJ SOLN
INTRAMUSCULAR | Status: DC | PRN
  Administered 2020-01-28: 50 mg via INTRAVENOUS

## 2020-01-28 MED ORDER — CEFAZOLIN SODIUM-DEXTROSE 2-4 GM/100ML-% IV SOLN
2.0000 g | INTRAVENOUS | Status: AC
  Administered 2020-01-28: 2 g via INTRAVENOUS

## 2020-01-28 MED ORDER — KETOROLAC TROMETHAMINE 15 MG/ML IJ SOLN
INTRAMUSCULAR | Status: AC
  Administered 2020-01-28: 15 mg via INTRAVENOUS
  Filled 2020-01-28: qty 1

## 2020-01-28 MED ORDER — ACETAMINOPHEN 500 MG PO TABS
ORAL_TABLET | ORAL | Status: AC
  Administered 2020-01-28: 1000 mg via ORAL
  Filled 2020-01-28: qty 2

## 2020-01-28 MED ORDER — ACETAMINOPHEN 500 MG PO TABS: 1000.0000 mg | ORAL_TABLET | ORAL | Status: AC

## 2020-01-28 MED ORDER — KETOROLAC TROMETHAMINE 15 MG/ML IJ SOLN
INTRAMUSCULAR | Status: DC | PRN
  Administered 2020-01-28: 15 mg via INTRAVENOUS

## 2020-01-28 MED ORDER — DEXAMETHASONE SODIUM PHOSPHATE 10 MG/ML IJ SOLN: 4.0000 mg | INTRAMUSCULAR | Status: AC

## 2020-01-28 MED ORDER — ONDANSETRON HCL 4 MG/2ML IJ SOLN
INTRAMUSCULAR | Status: DC | PRN
  Administered 2020-01-28: 4 mg via INTRAVENOUS

## 2020-01-28 MED ORDER — KETAMINE HCL 50 MG/ML IJ SOLN
INTRAMUSCULAR | Status: AC
  Filled 2020-01-28: qty 10

## 2020-01-28 MED ORDER — GLYCOPYRROLATE 0.2 MG/ML IJ SOLN
INTRAMUSCULAR | Status: DC | PRN
  Administered 2020-01-28: .2 mg via INTRAVENOUS

## 2020-01-28 MED ORDER — HEPARIN SODIUM (PORCINE) 5000 UNIT/ML IJ SOLN: 5000.0000 [IU] | INTRAMUSCULAR | Status: AC

## 2020-01-28 MED ORDER — GABAPENTIN 300 MG PO CAPS
ORAL_CAPSULE | ORAL | Status: AC
  Administered 2020-01-28: 600 mg via ORAL
  Filled 2020-01-28: qty 2

## 2020-01-28 MED ORDER — FENTANYL CITRATE (PF) 100 MCG/2ML IJ SOLN
INTRAMUSCULAR | Status: AC
  Filled 2020-01-28: qty 2

## 2020-01-28 MED ORDER — FAMOTIDINE 20 MG PO TABS
ORAL_TABLET | ORAL | Status: AC
  Filled 2020-01-28: qty 1

## 2020-01-28 MED ORDER — MIDAZOLAM HCL 2 MG/2ML IJ SOLN
INTRAMUSCULAR | Status: AC
  Filled 2020-01-28: qty 2

## 2020-01-28 MED ORDER — ROCURONIUM BROMIDE 10 MG/ML (PF) SYRINGE
PREFILLED_SYRINGE | INTRAVENOUS | Status: AC
  Filled 2020-01-28: qty 10

## 2020-01-28 MED ORDER — HEPARIN SODIUM (PORCINE) 5000 UNIT/ML IJ SOLN
INTRAMUSCULAR | Status: AC
  Administered 2020-01-28: 5000 [IU] via SUBCUTANEOUS
  Filled 2020-01-28: qty 1

## 2020-01-28 MED ORDER — FENTANYL CITRATE (PF) 100 MCG/2ML IJ SOLN
25.0000 ug | INTRAMUSCULAR | Status: DC | PRN
  Administered 2020-01-28: 25 ug via INTRAVENOUS

## 2020-01-28 MED ORDER — MIDAZOLAM HCL 2 MG/2ML IJ SOLN
INTRAMUSCULAR | Status: DC | PRN
  Administered 2020-01-28: 2 mg via INTRAVENOUS

## 2020-01-28 MED ORDER — POVIDONE-IODINE 10 % EX SWAB: 2.0000 "application " | Freq: Once | CUTANEOUS | Status: DC

## 2020-01-28 MED ORDER — FENTANYL CITRATE (PF) 100 MCG/2ML IJ SOLN
INTRAMUSCULAR | Status: AC
  Administered 2020-01-28: 25 ug via INTRAVENOUS
  Filled 2020-01-28: qty 2

## 2020-01-28 MED ORDER — SUGAMMADEX SODIUM 200 MG/2ML IV SOLN
INTRAVENOUS | Status: DC | PRN
  Administered 2020-01-28: 160 mg via INTRAVENOUS

## 2020-01-28 MED ORDER — KETOROLAC TROMETHAMINE 15 MG/ML IJ SOLN: 15.0000 mg | INTRAMUSCULAR | Status: AC

## 2020-01-28 MED ORDER — ACETAMINOPHEN 500 MG PO TABS: 1000.0000 mg | ORAL_TABLET | Freq: Four times a day (QID) | ORAL | 2 refills | Status: AC

## 2020-01-28 MED ORDER — DEXAMETHASONE SODIUM PHOSPHATE 10 MG/ML IJ SOLN
INTRAMUSCULAR | Status: AC
  Administered 2020-01-28: 4 mg via INTRAVENOUS
  Filled 2020-01-28: qty 1

## 2020-01-28 MED ORDER — CHLORHEXIDINE GLUCONATE 0.12 % MT SOLN
OROMUCOSAL | Status: AC
  Administered 2020-01-28: 15 mL via OROMUCOSAL
  Filled 2020-01-28: qty 15

## 2020-01-28 MED ORDER — ORAL CARE MOUTH RINSE: 15.0000 mL | Freq: Once | OROMUCOSAL | Status: AC

## 2020-01-28 MED ORDER — SODIUM CHLORIDE 0.9 % IV SOLN
200.0000 mg | Freq: Once | INTRAVENOUS | Status: AC
  Administered 2020-01-28: 200 mg via INTRAVENOUS
  Filled 2020-01-28: qty 10

## 2020-01-28 MED ORDER — PROPOFOL 10 MG/ML IV BOLUS
INTRAVENOUS | Status: AC
  Filled 2020-01-28: qty 20

## 2020-01-28 MED ORDER — FAMOTIDINE 20 MG PO TABS
20.0000 mg | ORAL_TABLET | Freq: Once | ORAL | Status: AC
  Administered 2020-01-28: 20 mg via ORAL

## 2020-01-28 MED ORDER — ROCURONIUM BROMIDE 100 MG/10ML IV SOLN
INTRAVENOUS | Status: DC | PRN
  Administered 2020-01-28: 50 mg via INTRAVENOUS
  Administered 2020-01-28: 10 mg via INTRAVENOUS

## 2020-01-28 MED ORDER — GABAPENTIN 300 MG PO CAPS: 600.0000 mg | ORAL_CAPSULE | ORAL | Status: AC

## 2020-01-28 MED ORDER — PROMETHAZINE HCL 25 MG/ML IJ SOLN: 6.2500 mg | INTRAMUSCULAR | Status: DC | PRN

## 2020-01-28 MED ORDER — FENTANYL CITRATE (PF) 250 MCG/5ML IJ SOLN
INTRAMUSCULAR | Status: DC | PRN
  Administered 2020-01-28 (×2): 50 ug via INTRAVENOUS

## 2020-01-28 MED ORDER — LIDOCAINE HCL (PF) 2 % IJ SOLN
INTRAMUSCULAR | Status: AC
  Filled 2020-01-28: qty 5

## 2020-01-28 MED ORDER — KETOROLAC TROMETHAMINE 30 MG/ML IJ SOLN
INTRAMUSCULAR | Status: AC
  Filled 2020-01-28: qty 1

## 2020-01-28 MED ORDER — CEFAZOLIN SODIUM-DEXTROSE 2-4 GM/100ML-% IV SOLN
INTRAVENOUS | Status: AC
  Filled 2020-01-28: qty 100

## 2020-01-28 MED ORDER — SCOPOLAMINE 1 MG/3DAYS TD PT72: 1.0000 | MEDICATED_PATCH | TRANSDERMAL | Status: DC

## 2020-01-28 MED ORDER — DEXAMETHASONE SODIUM PHOSPHATE 10 MG/ML IJ SOLN
INTRAMUSCULAR | Status: DC | PRN
  Administered 2020-01-28: 10 mg via INTRAVENOUS

## 2020-01-28 MED ORDER — SCOPOLAMINE 1 MG/3DAYS TD PT72
MEDICATED_PATCH | TRANSDERMAL | Status: AC
  Administered 2020-01-28: 1.5 mg via TRANSDERMAL
  Filled 2020-01-28: qty 1

## 2020-01-28 MED ORDER — LIDOCAINE HCL (CARDIAC) PF 100 MG/5ML IV SOSY
PREFILLED_SYRINGE | INTRAVENOUS | Status: DC | PRN
  Administered 2020-01-28: 80 mg via INTRAVENOUS

## 2020-01-28 MED ORDER — OXYCODONE HCL 5 MG PO TABS: 5.0000 mg | ORAL_TABLET | ORAL | 0 refills | Status: AC | PRN

## 2020-01-28 MED ORDER — CHLORHEXIDINE GLUCONATE 0.12 % MT SOLN: 15.0000 mL | Freq: Once | OROMUCOSAL | Status: AC

## 2020-01-28 MED ORDER — LACTATED RINGERS IV SOLN: INTRAVENOUS | Status: DC

## 2020-01-28 MED ORDER — DEXAMETHASONE SODIUM PHOSPHATE 10 MG/ML IJ SOLN
INTRAMUSCULAR | Status: AC
  Filled 2020-01-28: qty 1

## 2020-01-28 MED ORDER — PROPOFOL 10 MG/ML IV BOLUS
INTRAVENOUS | Status: DC | PRN
  Administered 2020-01-28: 120 mg via INTRAVENOUS

## 2020-01-28 MED ORDER — ONDANSETRON HCL 4 MG/2ML IJ SOLN
INTRAMUSCULAR | Status: AC
  Filled 2020-01-28: qty 2

## 2020-01-28 SURGICAL SUPPLY — 59 items
APPLICATOR ARISTA FLEXITIP XL (MISCELLANEOUS) IMPLANT
BACTOSHIELD CHG 4% 4OZ (MISCELLANEOUS) ×2
BAG URINE DRAIN 2000ML AR STRL (UROLOGICAL SUPPLIES) IMPLANT
BLADE SURG SZ11 CARB STEEL (BLADE) ×3 IMPLANT
CANISTER SUCT 1200ML W/VALVE (MISCELLANEOUS) IMPLANT
CATH FOLEY 2WAY  5CC 16FR (CATHETERS) ×3
CATH URTH 16FR FL 2W BLN LF (CATHETERS) ×1 IMPLANT
CHLORAPREP W/TINT 26 (MISCELLANEOUS) ×6 IMPLANT
COUNTER NEEDLE 20/40 LG (NEEDLE) ×3 IMPLANT
COVER LIGHT HANDLE STERIS (MISCELLANEOUS) ×3 IMPLANT
COVER WAND RF STERILE (DRAPES) ×3 IMPLANT
DEFOGGER SCOPE WARMER CLEARIFY (MISCELLANEOUS) ×3 IMPLANT
DERMABOND ADVANCED (GAUZE/BANDAGES/DRESSINGS) ×2
DERMABOND ADVANCED .7 DNX12 (GAUZE/BANDAGES/DRESSINGS) ×1 IMPLANT
DEVICE SUTURE ENDOST 10MM (ENDOMECHANICALS) IMPLANT
DRAPE 3/4 80X56 (DRAPES) ×3 IMPLANT
DRAPE LEGGINS SURG 28X43 STRL (DRAPES) ×3 IMPLANT
DRAPE UNDER BUTTOCK W/FLU (DRAPES) ×3 IMPLANT
ELECT REM PT RETURN 9FT ADLT (ELECTROSURGICAL) ×3
ELECTRODE REM PT RTRN 9FT ADLT (ELECTROSURGICAL) ×1 IMPLANT
GAUZE SPONGE 4X4 16PLY XRAY LF (GAUZE/BANDAGES/DRESSINGS) ×6 IMPLANT
GLOVE PI ORTHOPRO 6.5 (GLOVE) ×8
GLOVE PI ORTHOPRO STRL 6.5 (GLOVE) ×4 IMPLANT
GLOVE SURG SYN 6.5 ES PF (GLOVE) ×12 IMPLANT
GOWN STRL REUS W/ TWL LRG LVL3 (GOWN DISPOSABLE) ×3 IMPLANT
GOWN STRL REUS W/ TWL XL LVL3 (GOWN DISPOSABLE) ×1 IMPLANT
GOWN STRL REUS W/TWL LRG LVL3 (GOWN DISPOSABLE) ×9
GOWN STRL REUS W/TWL XL LVL3 (GOWN DISPOSABLE) ×3
HANDLE YANKAUER SUCT BULB TIP (MISCELLANEOUS) ×3 IMPLANT
HEMOSTAT ARISTA ABSORB 3G PWDR (HEMOSTASIS) ×3 IMPLANT
IRRIGATION STRYKERFLOW (MISCELLANEOUS) IMPLANT
IRRIGATOR STRYKERFLOW (MISCELLANEOUS)
IV LACTATED RINGERS 1000ML (IV SOLUTION) IMPLANT
KIT PINK PAD W/HEAD ARE REST (MISCELLANEOUS) ×3
KIT PINK PAD W/HEAD ARM REST (MISCELLANEOUS) ×1 IMPLANT
KIT TURNOVER CYSTO (KITS) ×3 IMPLANT
L-HOOK LAP DISP 36CM (ELECTROSURGICAL) ×3
LABEL OR SOLS (LABEL) ×3 IMPLANT
LHOOK LAP DISP 36CM (ELECTROSURGICAL) ×1 IMPLANT
LIGASURE VESSEL 5MM BLUNT TIP (ELECTROSURGICAL) ×3 IMPLANT
MANIPULATOR VCARE LG CRV RETR (MISCELLANEOUS) ×3 IMPLANT
MANIPULATOR VCARE SML CRV RETR (MISCELLANEOUS) IMPLANT
MANIPULATOR VCARE STD CRV RETR (MISCELLANEOUS) IMPLANT
NS IRRIG 500ML POUR BTL (IV SOLUTION) ×3 IMPLANT
PACK LAP CHOLECYSTECTOMY (MISCELLANEOUS) ×3 IMPLANT
PAD OB MATERNITY 4.3X12.25 (PERSONAL CARE ITEMS) ×3 IMPLANT
PAD PREP 24X41 OB/GYN DISP (PERSONAL CARE ITEMS) ×3 IMPLANT
PENCIL ELECTRO HAND CTR (MISCELLANEOUS) ×3 IMPLANT
SCRUB CHG 4% DYNA-HEX 4OZ (MISCELLANEOUS) ×1 IMPLANT
SET CYSTO W/LG BORE CLAMP LF (SET/KITS/TRAYS/PACK) IMPLANT
SLEEVE ENDOPATH XCEL 5M (ENDOMECHANICALS) ×9 IMPLANT
SURGILUBE 2OZ TUBE FLIPTOP (MISCELLANEOUS) ×3 IMPLANT
SUT ENDO VLOC 180-0-8IN (SUTURE) IMPLANT
SUT MNCRL 4-0 (SUTURE) ×3
SUT MNCRL 4-0 27XMFL (SUTURE) ×1
SUT VIC AB 0 CT1 36 (SUTURE) ×6 IMPLANT
SUTURE MNCRL 4-0 27XMF (SUTURE) ×1 IMPLANT
TROCAR XCEL NON-BLD 5MMX100MML (ENDOMECHANICALS) IMPLANT
TUBING EVAC SMOKE HEATED PNEUM (TUBING) ×3 IMPLANT

## 2020-01-28 NOTE — Anesthesia Procedure Notes (Signed)
Procedure Name: Intubation Date/Time: 01/28/2020 9:54 AM Performed by: Eben Burow, CRNA Pre-anesthesia Checklist: Patient identified, Emergency Drugs available, Suction available and Patient being monitored Patient Re-evaluated:Patient Re-evaluated prior to induction Oxygen Delivery Method: Circle system utilized Preoxygenation: Pre-oxygenation with 100% oxygen Induction Type: IV induction Ventilation: Mask ventilation without difficulty Laryngoscope Size: Mac and 3 Grade View: Grade I Tube type: Oral Tube size: 7.0 mm Number of attempts: 1 Placement Confirmation: positive ETCO2,  breath sounds checked- equal and bilateral and ETT inserted through vocal cords under direct vision Secured at: 20 cm Tube secured with: Tape Dental Injury: Teeth and Oropharynx as per pre-operative assessment

## 2020-01-28 NOTE — Interval H&P Note (Signed)
History and Physical Interval Note:  01/28/2020 9:04 AM  Vanessa Chaney  has presented today for surgery, with the diagnosis of menorrhagia, ovarian cyst.  The various methods of treatment have been discussed with the patient and family. After consideration of risks, benefits and other options for treatment, the patient has consented to  Procedure(s): TOTAL LAPAROSCOPIC HYSTERECTOMY WITH BILATERAL SALPINGECTOMY OVARIAN CYSTECTOMY (Bilateral) as a surgical intervention.  The patient's history has been reviewed, patient examined, no change in status, stable for surgery.  I have reviewed the patient's chart and labs.  Questions were answered to the patient's satisfaction.     [place]

## 2020-01-28 NOTE — Discharge Instructions (Signed)
Discharge instructions:  Call office if you have any of the following: fever >101 F, chills, shortness of breath, excessive vaginal bleeding, incision drainage or problems, leg pain or redness, or any other concerns.   Activity: Do not lift > 20 lbs for 8 weeks.  No intercourse or tampons for 8 weeks.  No driving until you are certain you can slam on the brakes, and of course, never while taking narcotics.   You may feel some pain in your upper right abdomen/rib and right shoulder.  This is from the gas in the abdomen for surgery. This will subside over time, please be patient!  Take 800mg  Ibuprofen and 1000mg  Tylenol together, around the clock, every 6 hours for at least the first 3-5 days.  After this you can take as needed.  This will help decrease inflammation and promote healing.  The narcotics you'll take just as needed, as they just trick your brain into thinking its not in pain.    Please don't limit yourself in terms of routine activity.  You will be able to do most things, although they may take longer to do or be a little painful.  You can do it!  Don't be a hero, but don't be a wimp either!

## 2020-01-28 NOTE — Op Note (Addendum)
Total Laparoscopic Hysterectomy Operative Note Procedure Date: 01/28/2020  Patient:  Vanessa Chaney  34 y.o. female  PRE-OPERATIVE DIAGNOSIS:  menorrhagia, ovarian cyst  POST-OPERATIVE DIAGNOSIS:  menorrhagia, ovarian cyst  PROCEDURE:  Procedure(s): TOTAL LAPAROSCOPIC HYSTERECTOMY WITH BILATERAL SALPINGECTOMY (Bilateral)  LYSIS OF ADHESIONS  SURGEON:  Surgeon(s) and Role:    * Derren Suydam, Honor Loh, MD - Primary    * Benjaman Kindler, MD - Assisting  ANESTHESIA:  General via ET  I/O  Total I/O In: 600 [I.V.:500; IV Piggyback:100] Out: 110 [Urine:100; Blood:10]  FINDINGS:   Bimanual exam:  Top-normal sized uterus, mobile, no adnexal masses.  normal external genitalia, high cervix. Laparoscopic: Enlarged uterus with anterior peritoneal adhesions,  normal ovaries and previously ligated fallopian tubes bilaterally.  Normal upper abdomen.  SPECIMEN: Uterus, Cervix, and bilateral fallopian tubes  COMPLICATIONS: none apparent  DISPOSITION: vital signs stable to PACU  Indication for Surgery: 34 y.o. [id number] with heavy and abnormal menstrual bleeding and anemia, requesting definitive treatment with hysterectomy.   Risks of surgery were discussed with the patient including but not limited to: bleeding which may require transfusion or reoperation; infection which may require antibiotics; injury to bowel, bladder, ureters or other surrounding organs; need for additional procedures including laparotomy, blood clot, incisional problems and other postoperative/anesthesia complications. Written informed consent was obtained.      PROCEDURE IN DETAIL:  The patient had 5000u Heparin Sub-q and sequential compression devices applied to her lower extremities while in the preoperative area.  She was then taken to the operating room. IV antibiotics were given. General anesthesia was administered via endotracheal route.  She was placed in the dorsal lithotomy position, and was prepped and draped in a  sterile manner. A surgical time-out was performed.  A Foley catheter was inserted into her bladder and attached to constant drainage and a V-Care uterine manipulator was then advanced into the uterus and a good fit around the cervix was noted. Attention was turned to the abdomen where an umbilical incision was made with the scalpel.  A 19mm trochar was inserted in the umbilical incision using a visiport method.Opening pressure was 63mmHg, and the abdomen was insufflated to 15mmHg carbon dioxide gas and adequate pneumoperitoneum was obtained. A survey of the patient's pelvis and abdomen revealed the findings as mentioned above. Two 91mm ports were inserted in the lower left and right quadrants under visualization.    The bilateral fallopian tubes were separated from the mesosalpinx using the Ligasure. The bilateral round ligaments were transected and anterior broad ligament divided from the anterior uterus, then bluntly dissected to expose the vesicouterine peritoneum and create a bladder flap. The bladder was pushed away from the uterus. The bilateral uterine arteries were skeletonized, ligated and transected. The bilateral uterosacral and cardinal ligaments were ligated and transected. A colpotomy was made around the V-Care cervical cup and the uterus, cervix, and bilateral tubes were removed through the vagina. The vaginal cuff was closed vaginally using 0-Vicryl in a running locking stitch. This was tested for integrity. The pneumoperitoneum was recreated and surgical site inspected, and found to be hemostatic. Bilateral ureters were visualized vermiuclating. No intraoperative injury to surrounding organs was noted. The abdomen was desufflated and all instruments and foley catheter were then removed.   All skin incisions were closed with 4-0 monocryl and covered with surgical glue. The patient tolerated the procedures well.  All instruments, needles, and sponge counts were correct x 2. The patient  was taken to the recovery room in stable condition.   ----  Larey Days, MD Attending Obstetrician and Gynecologist [facility]

## 2020-01-28 NOTE — Anesthesia Postprocedure Evaluation (Signed)
Anesthesia Post Note  Patient: Vanessa Chaney  Procedure(s) Performed: TOTAL LAPAROSCOPIC HYSTERECTOMY WITH BILATERAL SALPINGECTOMY (Bilateral )  Patient location during evaluation: PACU Anesthesia Type: General Level of consciousness: awake and alert Pain management: pain level controlled Vital Signs Assessment: post-procedure vital signs reviewed and stable Respiratory status: spontaneous breathing, nonlabored ventilation, respiratory function stable and patient connected to nasal cannula oxygen Cardiovascular status: blood pressure returned to baseline and stable Postop Assessment: no apparent nausea or vomiting Anesthetic complications: no   No complications documented.   Last Vitals:  Vitals:   01/28/20 1331 01/28/20 1400  BP: 129/86 (!) 163/77  Pulse: (!) 50 (!) 53  Resp: 13 12  Temp:  36.7 C  SpO2: 93% 98%    Last Pain:  Vitals:   01/28/20 1400  TempSrc: Temporal  PainSc: 2                  Martha Clan

## 2020-01-28 NOTE — Transfer of Care (Signed)
Immediate Anesthesia Transfer of Care Note  Patient: Vanessa Chaney  Procedure(s) Performed: TOTAL LAPAROSCOPIC HYSTERECTOMY WITH BILATERAL SALPINGECTOMY (Bilateral )  Patient Location: PACU  Anesthesia Type:General  Level of Consciousness: drowsy  Airway & Oxygen Therapy: Patient Spontanous Breathing and Patient connected to face mask oxygen  Post-op Assessment: Report given to RN and Post -op Vital signs reviewed and stable  Post vital signs: Reviewed and stable  Last Vitals:  Vitals Value Taken Time  BP 126/74 01/28/20 1201  Temp    Pulse 51 01/28/20 1203  Resp 16 01/28/20 1204  SpO2 100 % 01/28/20 1203  Vitals shown include unvalidated device data.  Last Pain:  Vitals:   01/28/20 0833  TempSrc: Oral  PainSc: 0-No pain         Complications: No complications documented.

## 2020-01-28 NOTE — Anesthesia Preprocedure Evaluation (Signed)
Anesthesia Evaluation  Patient identified by MRN, date of birth, ID band Patient awake    Reviewed: Allergy & Precautions, H&P , NPO status , Patient's Chart, lab work & pertinent test results, reviewed documented beta blocker date and time   History of Anesthesia Complications (+) Family history of anesthesia reaction and history of anesthetic complications  Airway Mallampati: II  TM Distance: >3 FB Neck ROM: full    Dental  (+) Dental Advidsory Given, Teeth Intact Permanent retainer on top and bottom:   Pulmonary neg pulmonary ROS,    Pulmonary exam normal breath sounds clear to auscultation       Cardiovascular Exercise Tolerance: Good negative cardio ROS Normal cardiovascular exam Rhythm:regular Rate:Normal     Neuro/Psych  Headaches, neg Seizures negative psych ROS   GI/Hepatic negative GI ROS, Neg liver ROS,   Endo/Other  negative endocrine ROS  Renal/GU negative Renal ROS  negative genitourinary   Musculoskeletal   Abdominal   Peds  Hematology negative hematology ROS (+)   Anesthesia Other Findings Past Medical History: No date: Allergy No date: Anemia No date: Chicken pox No date: Family history of adverse reaction to anesthesia     Comment:  mom-n/v No date: Frequent headaches     Comment:  migraines 12/2014: Genital herpes     Comment:  pos HSV culture   Reproductive/Obstetrics negative OB ROS                             Anesthesia Physical Anesthesia Plan  ASA: II  Anesthesia Plan: General   Post-op Pain Management:    Induction: Intravenous  PONV Risk Score and Plan: 3 and Ondansetron, Dexamethasone, Midazolam and Promethazine  Airway Management Planned: Oral ETT  Additional Equipment:   Intra-op Plan:   Post-operative Plan: Extubation in OR  Informed Consent: I have reviewed the patients History and Physical, chart, labs and discussed the procedure  including the risks, benefits and alternatives for the proposed anesthesia with the patient or authorized representative who has indicated his/her understanding and acceptance.     Dental Advisory Given  Plan Discussed with: Anesthesiologist, CRNA and Surgeon  Anesthesia Plan Comments:         Anesthesia Quick Evaluation

## 2020-01-29 ENCOUNTER — Encounter: Payer: Self-pay | Admitting: Obstetrics & Gynecology

## 2020-02-01 LAB — SURGICAL PATHOLOGY

## 2020-03-28 ENCOUNTER — Other Ambulatory Visit: Payer: Self-pay | Admitting: Internal Medicine

## 2020-03-28 ENCOUNTER — Ambulatory Visit: Payer: 59 | Attending: Internal Medicine

## 2020-03-28 DIAGNOSIS — Z23 Encounter for immunization: Secondary | ICD-10-CM

## 2020-03-28 NOTE — Progress Notes (Signed)
   Covid-19 Vaccination Clinic  Name:  Vanessa Chaney    MRN: [id number] DOB: 17-Oct-1985  03/28/2020  [name] was observed post Covid-19 immunization for 15 minutes without incident. She was provided with Vaccine Information Sheet and instruction to access the V-Safe system.   [name] was instructed to call 911 with any severe reactions post vaccine: Marland Kitchen Difficulty breathing  . Swelling of face and throat  . A fast heartbeat  . A bad rash all over body  . Dizziness and weakness   Immunizations Administered    Name Date Dose VIS Date Route   Pfizer COVID-19 Vaccine 03/28/2020 11:03 AM 0.3 mL 01/19/2020 Intramuscular   Manufacturer: [employer], [employer]   Lot: [id number]   NDC: [address]-5

## 2020-04-19 ENCOUNTER — Other Ambulatory Visit: Payer: Self-pay | Admitting: Obstetrics and Gynecology

## 2020-04-19 DIAGNOSIS — N6310 Unspecified lump in the right breast, unspecified quadrant: Secondary | ICD-10-CM

## 2020-04-27 ENCOUNTER — Ambulatory Visit
Admission: RE | Admit: 2020-04-27 | Discharge: 2020-04-27 | Disposition: A | Discharge status: Home / Self Care | Payer: 59 | Source: Ambulatory Visit | Attending: Obstetrics and Gynecology | Admitting: Obstetrics and Gynecology

## 2020-04-27 ENCOUNTER — Other Ambulatory Visit: Payer: Self-pay

## 2020-04-27 DIAGNOSIS — N6011 Diffuse cystic mastopathy of right breast: Secondary | ICD-10-CM | POA: Diagnosis not present

## 2020-04-27 DIAGNOSIS — N6310 Unspecified lump in the right breast, unspecified quadrant: Secondary | ICD-10-CM | POA: Diagnosis not present

## 2020-04-27 DIAGNOSIS — R928 Other abnormal and inconclusive findings on diagnostic imaging of breast: Secondary | ICD-10-CM | POA: Diagnosis not present

## 2020-05-01 ENCOUNTER — Other Ambulatory Visit: Payer: Self-pay | Admitting: Obstetrics and Gynecology

## 2020-05-01 DIAGNOSIS — N6001 Solitary cyst of right breast: Secondary | ICD-10-CM

## 2020-05-01 DIAGNOSIS — R928 Other abnormal and inconclusive findings on diagnostic imaging of breast: Secondary | ICD-10-CM

## 2020-06-26 DIAGNOSIS — Z20822 Contact with and (suspected) exposure to covid-19: Secondary | ICD-10-CM | POA: Diagnosis not present

## 2020-06-26 DIAGNOSIS — Z03818 Encounter for observation for suspected exposure to other biological agents ruled out: Secondary | ICD-10-CM | POA: Diagnosis not present

## 2020-06-28 DIAGNOSIS — J019 Acute sinusitis, unspecified: Secondary | ICD-10-CM | POA: Diagnosis not present

## 2020-06-28 DIAGNOSIS — R6889 Other general symptoms and signs: Secondary | ICD-10-CM | POA: Diagnosis not present

## 2020-06-28 DIAGNOSIS — R059 Cough, unspecified: Secondary | ICD-10-CM | POA: Diagnosis not present

## 2020-12-11 NOTE — Progress Notes (Signed)
Cardiology Office Note  Date:  12/12/2020   ID:  WYONA BALES, DOB 03-04-1986, MRN [id number]  PCP:  Patient, No Pcp Per (Inactive)  Followed by OB/GYN  Chief Complaint  Patient presents with   Irregular Heart Beat    Medications reviewed by the patient verbally.     HPI:  Vanessa Chaney is a 35 y.o. female with history of  anemia  Headaches/Migraines admission to [facility] from 04/16/2018 through 04/17/2018 for left arm pain and abnormal EKG.  Who presents today for discussion of her palpitations  LOV 08/2018 In follow-up today reports that work is very busy Works full-time job, has gone back to school now with 2 courses, Also 3 children: age 40 to 29 Situation very stressful  At rest has appreciated symptoms of SOB, chest tight  Starts neck down into chest Relief with albuterol  Otherwise active, does not have symptoms on exertion mainly at rest  On propranolol ER 80 daily for migraines  EKG personally reviewed by myself on todays visit Normal sinus rhythm nonspecific T wave abnormality, no change from prior study  Prior records reviewed urgent care in 04/2018 for left arm pain and an abnormal EKG with TWI in the anterior leads. Cardiac enzymes remained negative.    nuclear stress test on 04/17/2018 that showed no significant ischemia, EF 58%, no EKG changes concerning for ischemia and was an overall low risk scan.   echocardiogram  Normal ejection fraction, no valve disease  Labs:  LDL 85, A1c 5.9   Continues to have trouble with headaches, was started on Fioricet Also started on amlodipine 5 mg daily by Dr. Lacinda Axon in acute care She does not check her blood pressure much at home She has been having dizziness sitting up in bed in the morning  PMH:   has a past medical history of Allergy, Anemia, Chicken pox, Family history of adverse reaction to anesthesia, Frequent headaches, and Genital herpes (12/2014).  PSH:    Past Surgical History:  Procedure Laterality Date    CESAREAN SECTION  2005,2009,2013   TOTAL LAPAROSCOPIC HYSTERECTOMY WITH BILATERAL SALPINGO OOPHORECTOMY Bilateral 01/28/2020   Procedure: TOTAL LAPAROSCOPIC HYSTERECTOMY WITH BILATERAL SALPINGECTOMY;  Surgeon: Ward, Honor Loh, MD;  Location: [facility] ORS;  Service: Gynecology;  Laterality: Bilateral;   TUBAL LIGATION  2013    Current Outpatient Medications  Medication Sig Dispense Refill   AJOVY 225 MG/1.5ML SOAJ Inject 225 mg into the skin every 28 (twenty-eight) days.     Multiple Vitamin (MULTIVITAMIN WITH MINERALS) TABS tablet Take 1 tablet by mouth daily.     NURTEC 75 MG TBDP Take 75 mg by mouth daily as needed for migraine.     promethazine (PHENERGAN) 12.5 MG tablet Take 12.5 mg by mouth every 4 (four) hours as needed for nausea/vomiting.     acetaminophen (TYLENOL) 500 MG tablet Take 2 tablets (1,000 mg total) by mouth every 6 (six) hours. (Patient not taking: Reported on 12/12/2020) 100 tablet 2   COVID-19 mRNA vaccine, Pfizer, 30 MCG/0.3ML injection USE AS DIRECTED (Patient not taking: Reported on 12/12/2020) .3 mL 0   ibuprofen (ADVIL) 800 MG tablet Take 1 tablet (800 mg total) by mouth every 6 (six) hours. (Patient not taking: Reported on 12/12/2020) 45 tablet 0   oxyCODONE (ROXICODONE) 5 MG immediate release tablet Take 1 tablet (5 mg total) by mouth every 4 (four) hours as needed. (Patient not taking: Reported on 12/12/2020) 16 tablet 0   propranolol ER (INDERAL LA) 80 MG 24 hr  capsule Take 1 capsule (80 mg total) by mouth at bedtime. 90 capsule 3   No current facility-administered medications for this visit.    Allergies:   Latex and Shrimp [shellfish allergy]   Social History:  The patient  reports that she has never smoked. She has never used smokeless tobacco. She reports current alcohol use. She reports that she does not use drugs.   Family History:   family history includes Hypertension in her mother; Ovarian cancer in an other family member; Prostate cancer in her father;  Uterine cancer (age of onset: 54) in her paternal aunt.    Review of Systems: Review of Systems  Constitutional: Negative.   Respiratory: Negative.    Cardiovascular: Negative.   Gastrointestinal: Negative.   Musculoskeletal: Negative.   Neurological:  Positive for headaches.  Psychiatric/Behavioral: Negative.    All other systems reviewed and are negative.  PHYSICAL EXAM: VS:  BP (!) 132/92 (BP Location: Left Arm, Patient Position: Sitting, Cuff Size: Normal)   Pulse (!) 52   Ht '5\' 2"'$  (1.575 m)   Wt 184 lb 6 oz (83.6 kg)   LMP 01/01/2020 (Exact Date)   SpO2 98%   BMI 33.72 kg/m  , BMI Body mass index is 33.72 kg/m. GEN: Well nourished, well developed, in no acute distress  HEENT: normal  Neck: no JVD, carotid bruits, or masses Cardiac: RRR; no murmurs, rubs, or gallops,no edema  Respiratory:  clear to auscultation bilaterally, normal work of breathing GI: soft, nontender, nondistended, + BS MS: no deformity or atrophy  Skin: warm and dry, no rash Neuro:  Strength and sensation are intact Psych: euthymic mood, full affect  Recent Labs: 01/26/2020: BUN 10; Creatinine, Ser 0.73; Potassium 4.0; Sodium 138 01/28/2020: Hemoglobin 8.3; Platelets 294    Lipid Panel Lab Results  Component Value Date   CHOL 163 04/16/2018   HDL 66 04/16/2018   LDLCALC 85 04/16/2018   TRIG 61 04/16/2018      Wt Readings from Last 3 Encounters:  12/12/20 184 lb 6 oz (83.6 kg)  01/28/20 175 lb (79.4 kg)  09/15/18 164 lb (74.4 kg)    ASSESSMENT AND PLAN:  Precordial pain -tightness Atypical pain, negative stress test, prior echo normal Symptoms likely exacerbated by stress, has a job, 3 children, going to class with 2 courses  Chest tightness Likely secondary to stress as above Presenting at rest, no further testing needed Stress reduction techniques discussed  Benign essential HTN -  Blood pressure is well controlled on today's visit. No changes made to the  medications.  Acute nonintractable headache, unspecified headache type - On propranolol, symptoms stable   Total encounter time more than 25 minutes  Greater than 50% was spent in counseling and coordination of care with the patient   Disposition:   F/U as needed   Orders Placed This Encounter  Procedures   EKG 12-Lead      Signed, Esmond Plants, M.D., Ph.D. 12/12/2020  [place], [place]

## 2020-12-12 ENCOUNTER — Ambulatory Visit (INDEPENDENT_AMBULATORY_CARE_PROVIDER_SITE_OTHER): Payer: Self-pay | Admitting: Cardiovascular Disease

## 2020-12-12 ENCOUNTER — Encounter: Payer: Self-pay | Admitting: Cardiovascular Disease

## 2020-12-12 ENCOUNTER — Other Ambulatory Visit: Payer: Self-pay

## 2020-12-12 VITALS — BP 132/92 | HR 52 | Ht 62.0 in | Wt 184.4 lb

## 2020-12-12 DIAGNOSIS — I10 Essential (primary) hypertension: Secondary | ICD-10-CM

## 2020-12-12 MED ORDER — PROPRANOLOL HCL ER 80 MG PO CP24: 80.0000 mg | ORAL_CAPSULE | Freq: Every day | ORAL | 3 refills | Status: DC

## 2020-12-12 NOTE — Patient Instructions (Addendum)
Medication Instructions:  No changes  If you need a refill on your cardiac medications before your next appointment, please call your pharmacy.   Lab work: No new labs needed  Testing/Procedures: No new testing needed  Follow-Up: At [facility], you and your health needs are our priority.  As part of our continuing mission to provide you with exceptional heart care, we have created designated Provider Care Teams.  These Care Teams include your primary Cardiologist (physician) and Advanced Practice Providers (APPs -  Physician Assistants and Nurse Practitioners) who all work together to provide you with the care you need, when you need it.  You will need a follow up appointment as needed  Providers on your designated Care Team:   Murray Hodgkins, NP Christell Faith, PA-C Marrianne Mood, PA-C Cadence Perry, [place]  COVID-19 Vaccine Information can be found at: [email or web address] For questions related to vaccine distribution or appointments, please email [email or web address] or call [phone].

## 2021-03-09 ENCOUNTER — Ambulatory Visit: Payer: Self-pay

## 2021-11-01 ENCOUNTER — Other Ambulatory Visit (HOSPITAL_COMMUNITY): Payer: Self-pay | Admitting: Student

## 2021-11-01 ENCOUNTER — Other Ambulatory Visit: Payer: Self-pay | Admitting: Student

## 2021-11-01 DIAGNOSIS — M542 Cervicalgia: Secondary | ICD-10-CM

## 2021-11-01 DIAGNOSIS — G43019 Migraine without aura, intractable, without status migrainosus: Secondary | ICD-10-CM

## 2021-12-10 ENCOUNTER — Ambulatory Visit (HOSPITAL_COMMUNITY)
Admission: RE | Admit: 2021-12-10 | Discharge: 2021-12-10 | Disposition: A | Discharge status: Home / Self Care | Payer: 59 | Source: Ambulatory Visit | Attending: Student | Admitting: Student

## 2021-12-10 DIAGNOSIS — G43019 Migraine without aura, intractable, without status migrainosus: Secondary | ICD-10-CM | POA: Insufficient documentation

## 2021-12-10 DIAGNOSIS — M542 Cervicalgia: Secondary | ICD-10-CM | POA: Diagnosis present

## 2021-12-19 ENCOUNTER — Other Ambulatory Visit: Payer: Self-pay | Admitting: Student

## 2021-12-19 ENCOUNTER — Other Ambulatory Visit (HOSPITAL_COMMUNITY): Payer: Self-pay | Admitting: Student

## 2021-12-19 DIAGNOSIS — G43719 Chronic migraine without aura, intractable, without status migrainosus: Secondary | ICD-10-CM

## 2021-12-19 DIAGNOSIS — R9089 Other abnormal findings on diagnostic imaging of central nervous system: Secondary | ICD-10-CM

## 2021-12-27 ENCOUNTER — Ambulatory Visit
Admission: RE | Admit: 2021-12-27 | Discharge: 2021-12-27 | Disposition: A | Discharge status: Home / Self Care | Payer: 59 | Source: Ambulatory Visit | Attending: Student | Admitting: Student

## 2021-12-27 DIAGNOSIS — G43719 Chronic migraine without aura, intractable, without status migrainosus: Secondary | ICD-10-CM | POA: Diagnosis present

## 2021-12-27 DIAGNOSIS — R9089 Other abnormal findings on diagnostic imaging of central nervous system: Secondary | ICD-10-CM | POA: Diagnosis not present

## 2022-01-03 ENCOUNTER — Encounter: Payer: Self-pay | Admitting: *Deleted

## 2022-01-03 ENCOUNTER — Emergency Department
Admission: EM | Admit: 2022-01-03 | Discharge: 2022-01-03 | Disposition: A | Discharge status: Home / Self Care | Payer: 59 | Attending: Emergency Medicine | Admitting: Emergency Medicine

## 2022-01-03 ENCOUNTER — Other Ambulatory Visit: Payer: Self-pay

## 2022-01-03 DIAGNOSIS — R519 Headache, unspecified: Secondary | ICD-10-CM | POA: Diagnosis not present

## 2022-01-03 DIAGNOSIS — I10 Essential (primary) hypertension: Secondary | ICD-10-CM | POA: Insufficient documentation

## 2022-01-03 MED ORDER — PROPRANOLOL HCL ER 80 MG PO CP24: 80.0000 mg | ORAL_CAPSULE | Freq: Every day | ORAL | 11 refills | Status: AC

## 2022-01-03 MED ORDER — PROPRANOLOL HCL ER 80 MG PO CP24
80.0000 mg | ORAL_CAPSULE | Freq: Every day | ORAL | Status: DC
  Administered 2022-01-03: 80 mg via ORAL
  Filled 2022-01-03: qty 1

## 2022-01-03 NOTE — ED Provider Notes (Signed)
[facility] Provider Note  Patient Contact: 9:32 PM (approximate)   History   Hypertension   HPI  Vanessa Chaney is a 36 y.o. female who presents the emergency department complaining of hypertension.  Patient states that she has been out of her propanolol which she is primarily used for headache management for a while.  She was noted to have hypertension at her last neurology appointment.  Patient has been taking her blood pressure every day and noted that it was elevated and presents for evaluation.  She is still having headaches which are being followed by neurology.  There is no atypical features at this time.  No chest pain, shortness of breath.  Patient is here for blood pressure management.  She was been on propanolol 80 mg in the past which appears to have worked well for her.     Physical Exam   Triage Vital Signs: ED Triage Vitals  Enc Vitals Group     BP 01/03/22 1907 (!) 185/103     Pulse Rate 01/03/22 1907 69     Resp 01/03/22 1907 20     Temp 01/03/22 1907 98.1 F (36.7 C)     Temp Source 01/03/22 1907 Oral     SpO2 01/03/22 1907 98 %     Weight 01/03/22 1908 187 lb (84.8 kg)     Height 01/03/22 1908 '5\' 2"'$  (1.575 m)     Head Circumference --      Peak Flow --      Pain Score 01/03/22 1908 7     Pain Loc --      Pain Edu? --      Excl. in [place]? --     Most recent vital signs: Vitals:   01/03/22 1907  BP: (!) 185/103  Pulse: 69  Resp: 20  Temp: 98.1 F (36.7 C)  SpO2: 98%     General: Alert and in no acute distress. Eyes:  PERRL. EOMI.  Neck: No stridor. No cervical spine tenderness to palpation  Cardiovascular:  Good peripheral perfusion Respiratory: Normal respiratory effort without tachypnea or retractions. Lungs CTAB.  Musculoskeletal: Full range of motion to all extremities.  Neurologic:  No gross focal neurologic deficits are appreciated.  Cranial nerves II through XII grossly intact at this time. Skin:   No rash  noted Other:   ED Results / Procedures / Treatments   Labs (all labs ordered are listed, but only abnormal results are displayed) Labs Reviewed - No data to display   EKG     RADIOLOGY    No results found.  PROCEDURES:  Critical Care performed: No  Procedures   MEDICATIONS ORDERED IN ED: Medications  propranolol ER (INDERAL LA) 24 hr capsule 80 mg (has no administration in time range)     IMPRESSION / MDM / ASSESSMENT AND PLAN / ED COURSE  I reviewed the triage vital signs and the nursing notes.                              Differential diagnosis includes, but is not limited to, hypertension, hypertensive urgency, hypertensive crisis  Patient's presentation is most consistent with acute presentation with potential threat to life or bodily function.   Patient's diagnosis is consistent with hypertension.  Patient presents the emergency department for discussion about management of hypertension.  Patient has a history of hypertension, has been taking propanolol as needed for both headache and hypertension.  Patient was seen at her neurologist office for evaluation of her ongoing migraines and it was noted that she was hypertensive at the time.  Patient has been following her blood pressures and they have been remained elevated at home.  She has not been taking her propanolol as she has been out.  At this time we had a lengthy discussion, she has no atypical symptoms other than being concerned that her blood pressure remains elevated.  Patient will have a prescription for her propranolol refilled.  Follow-up primary care.  Return precautions discussed with the patient..  Patient is given ED precautions to return to the ED for any worsening or new symptoms.        FINAL CLINICAL IMPRESSION(S) / ED DIAGNOSES   Final diagnoses:  Primary hypertension     Rx / DC Orders   ED Discharge Orders          Ordered    propranolol ER (INDERAL LA) 80 MG 24 hr capsule   Daily        01/03/22 2201             Note:  This document was prepared using Dragon voice recognition software and may include unintentional dictation errors.   Brynda Peon 01/03/22 2203    Harvest Dark, MD 01/13/22 1447

## 2022-01-03 NOTE — ED Triage Notes (Signed)
Pt sent from [facility] for eval of headache and HTN.  No meds for HTN.  Pt has nausea.  Pt reports dizziness.  Pt alert  speech clear. Pt taking motrin with some relief.

## 2022-05-01 ENCOUNTER — Other Ambulatory Visit: Payer: Self-pay | Admitting: Student

## 2022-05-01 DIAGNOSIS — G43019 Migraine without aura, intractable, without status migrainosus: Secondary | ICD-10-CM

## 2022-05-06 NOTE — Progress Notes (Signed)
Patient for DG Lumbar Puncture on Tues 05/07/2022, I called and spoke with the patient on the  phone and gave pre-procedure instructions. Pt was made aware to be here at 9:30a at the new entrance.  Pt stated understanding.  Called 05/06/2022

## 2022-05-07 ENCOUNTER — Ambulatory Visit
Admission: RE | Admit: 2022-05-07 | Discharge: 2022-05-07 | Disposition: A | Discharge status: Home / Self Care | Payer: 59 | Source: Ambulatory Visit | Attending: Student | Admitting: Student

## 2022-05-07 DIAGNOSIS — G43019 Migraine without aura, intractable, without status migrainosus: Secondary | ICD-10-CM | POA: Insufficient documentation

## 2022-05-07 LAB — CSF CELL COUNT WITH DIFFERENTIAL
Eosinophils, CSF: 0 %
Lymphs, CSF: 50 %
Monocyte-Macrophage-Spinal Fluid: 0 %
RBC Count, CSF: 5 /mm3 — ABNORMAL HIGH (ref 0–3)
Segmented Neutrophils-CSF: 50 %
Tube #: 3
WBC, CSF: 2 /mm3 (ref 0–5)

## 2022-05-07 LAB — PROTEIN AND GLUCOSE, CSF
Glucose, CSF: 60 mg/dL (ref 40–70)
Total  Protein, CSF: 11 mg/dL — ABNORMAL LOW (ref 15–45)

## 2022-05-07 MED ORDER — ACETAMINOPHEN 500 MG PO TABS: 1000.0000 mg | ORAL_TABLET | Freq: Four times a day (QID) | ORAL | Status: DC | PRN

## 2022-05-07 MED ORDER — LIDOCAINE HCL (PF) 1 % IJ SOLN
30.0000 mL | Freq: Once | INTRAMUSCULAR | Status: AC
  Administered 2022-05-07: 15 mL

## 2022-05-07 NOTE — Discharge Instructions (Signed)
Lumbar Puncture, Care After Refer to this sheet in the next few weeks. These instructions provide you with information on caring for yourself after your procedure. Your health care provider may also give you more specific instructions. Your treatment has been planned according to current medical practices, but problems sometimes occur. Call your health care provider if you have any problems or questions after your procedure. What can I expect after the procedure? After your procedure, it is typical to have the following sensations: Mild discomfort or pain at the insertion site. Mild headache that is relieved with pain medicines.  Follow these instructions at home:  Avoid lifting anything heavier than 10 lb (4.5 kg) for at least 12 hours after the procedure. Drink enough fluids to keep your urine clear or pale yellow. Lay flat or as flat as possible for the remainder of the day. Contact a health care provider if: You have fever or chills. You have nausea or vomiting. You have a headache that lasts for more than 2 days. Get help right away if: You have any numbness or tingling in your legs. You are unable to control your bowel or bladder. You have bleeding or swelling in your back at the insertion site. You are dizzy or faint. This information is not intended to replace advice given to you by your health care provider. Make sure you discuss any questions you have with your health care provider. Document Released: 03/23/2013 Document Revised: 08/24/2015 Document Reviewed: 11/24/2012 Elsevier Interactive Patient Education  2017 [employer] 

## 2022-05-13 LAB — OLIGOCLONAL BANDS, CSF + SERM

## 2022-12-07 IMAGING — MG DIGITAL DIAGNOSTIC BILAT W/ TOMO W/ CAD
8 of 14 series · 8 of 40 positions shown · non-contrast
Comparison: None.

CLINICAL DATA: The patient presents with a painful palpable lump on
the right which has improved over the last several days.

EXAM:
DIGITAL DIAGNOSTIC BILATERAL MAMMOGRAM WITH CAD AND TOMOSYNTHESIS
ULTRASOUND RIGHT BREAST
TECHNIQUE: Bilateral digital diagnostic mammography and breast tomosynthesis
was performed. Digital images of the breasts were evaluated with
computer-aided detection. Targeted ultrasound examination of the
Right breast was performed.

[R MLO synth-2D (1 of 2)]
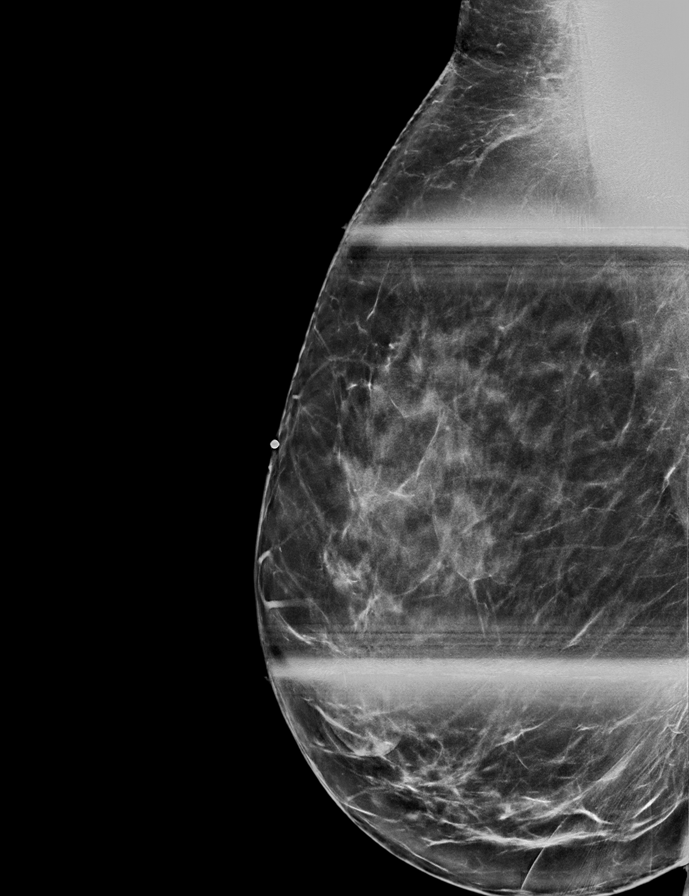

[L MLO synth-2D]
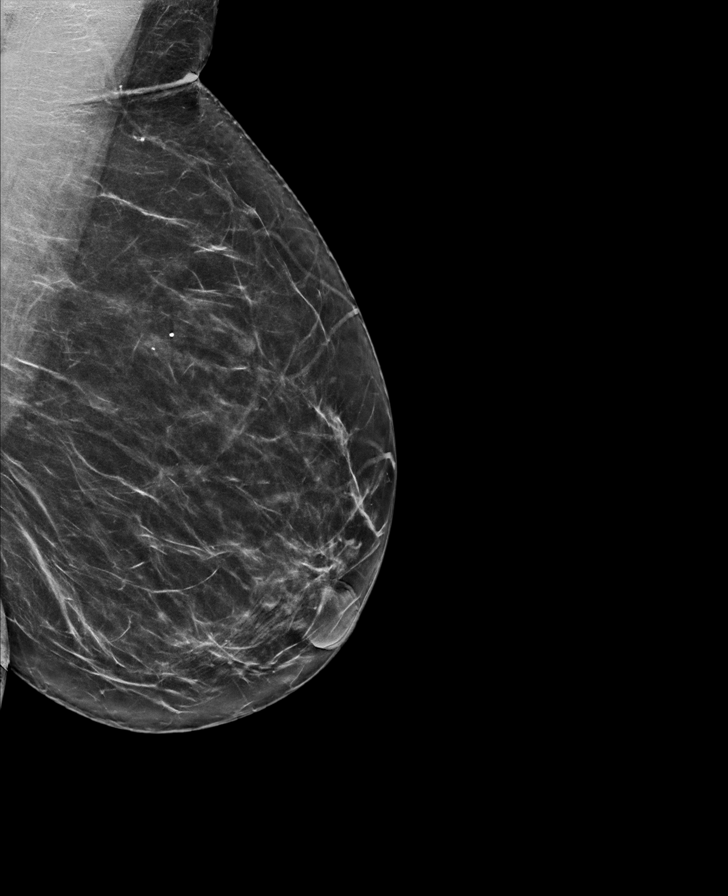

[R CC synth-2D (1 of 3)]
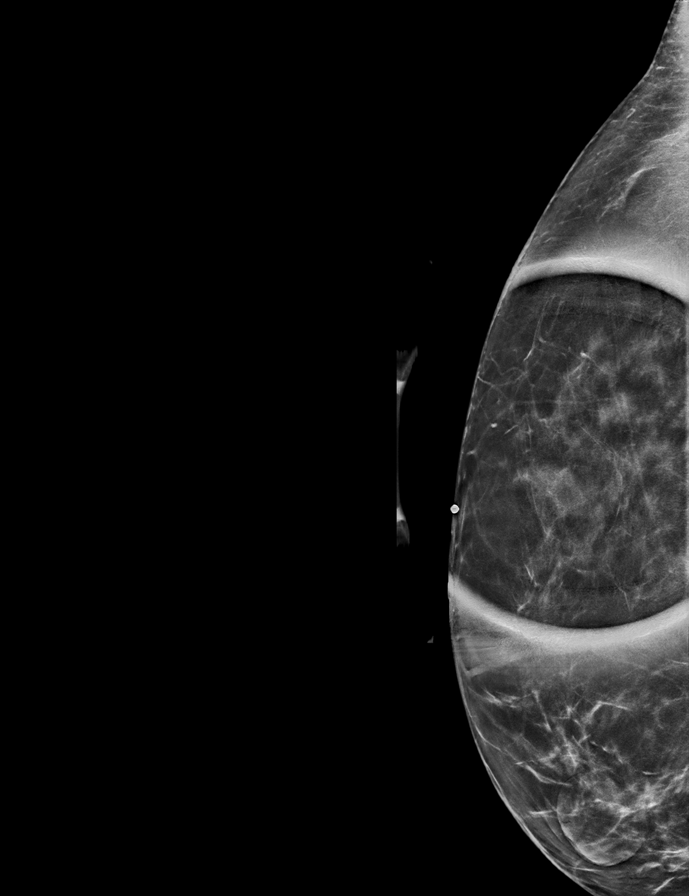

[R CC synth-2D (2 of 3)]
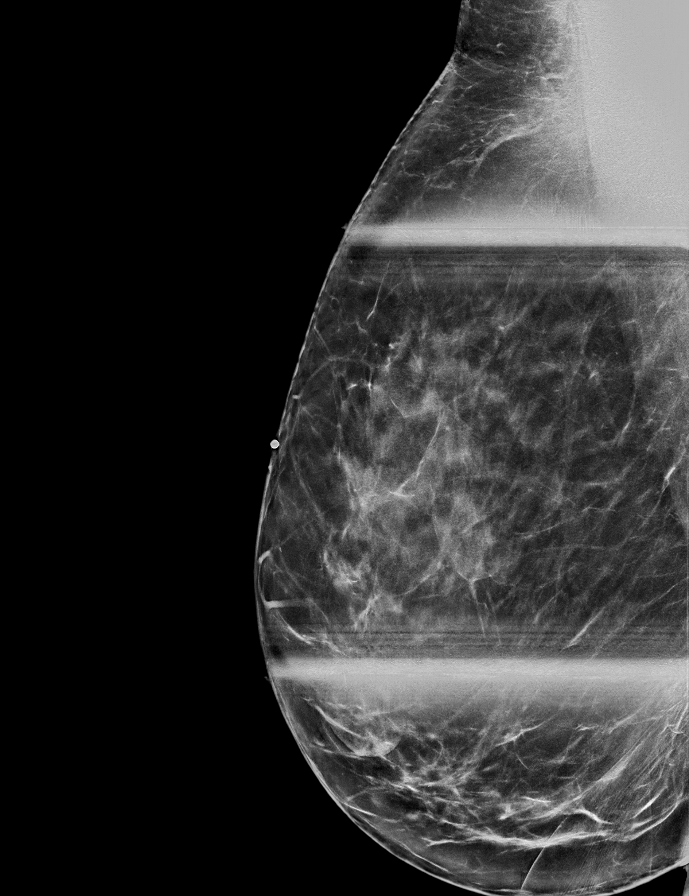

[R MLO synth-2D (2 of 2)]
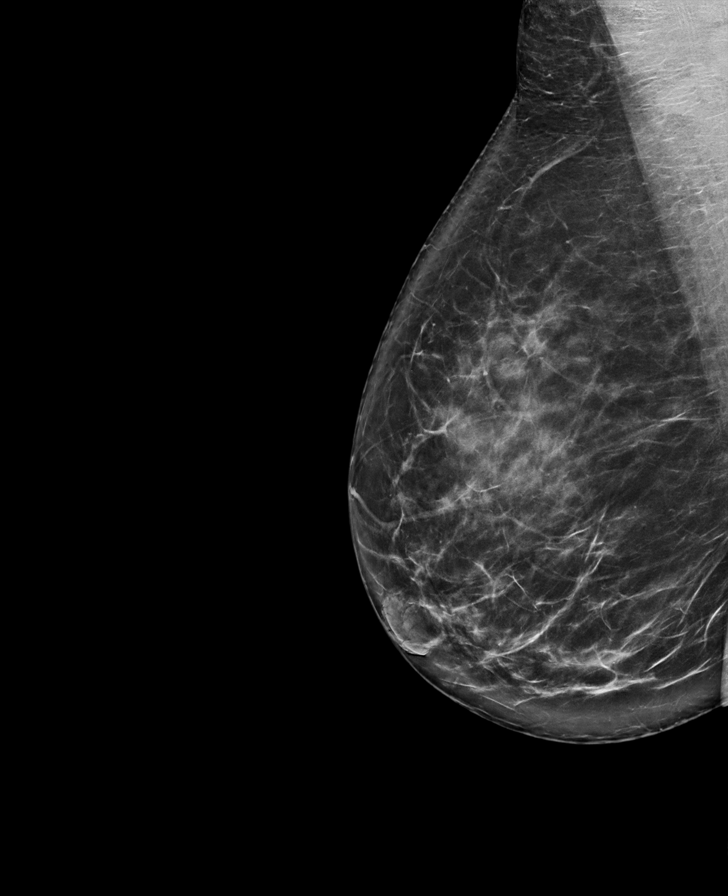

[R CC synth-2D (3 of 3)]
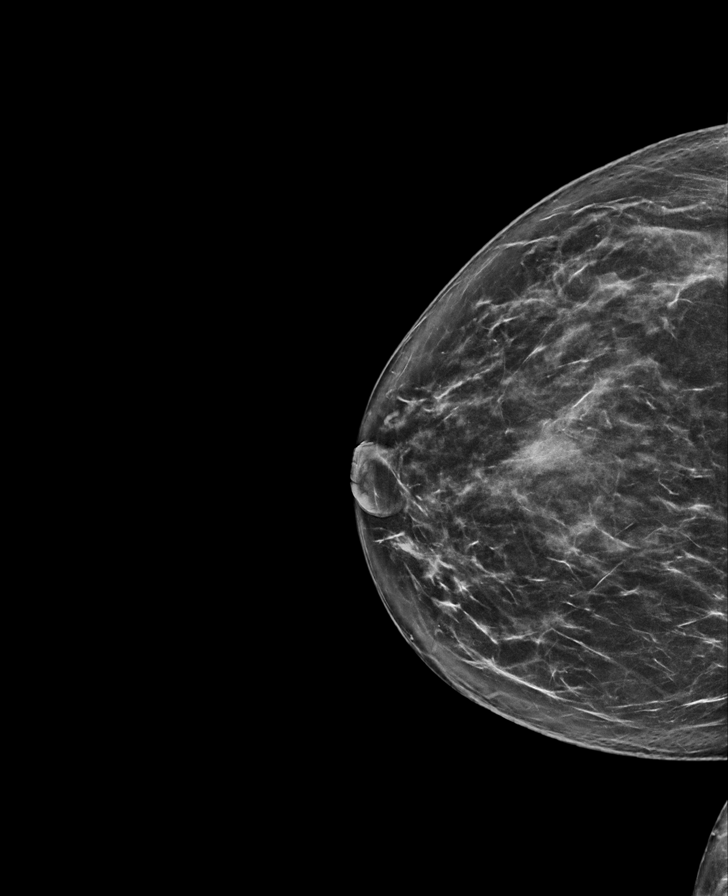

[L CC synth-2D]
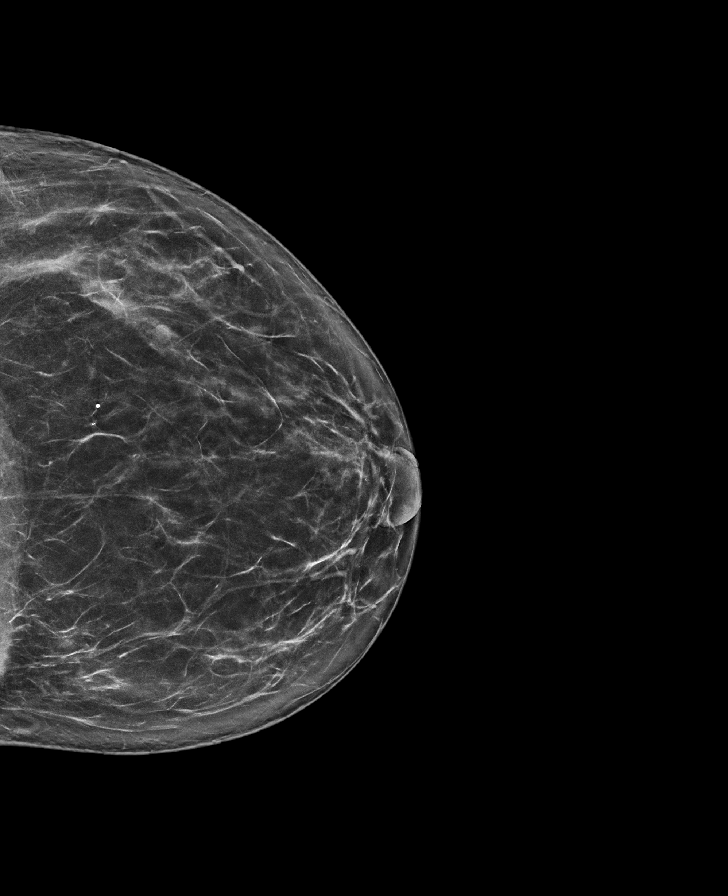

[R CC tomo · tomo slice 33/66.0]
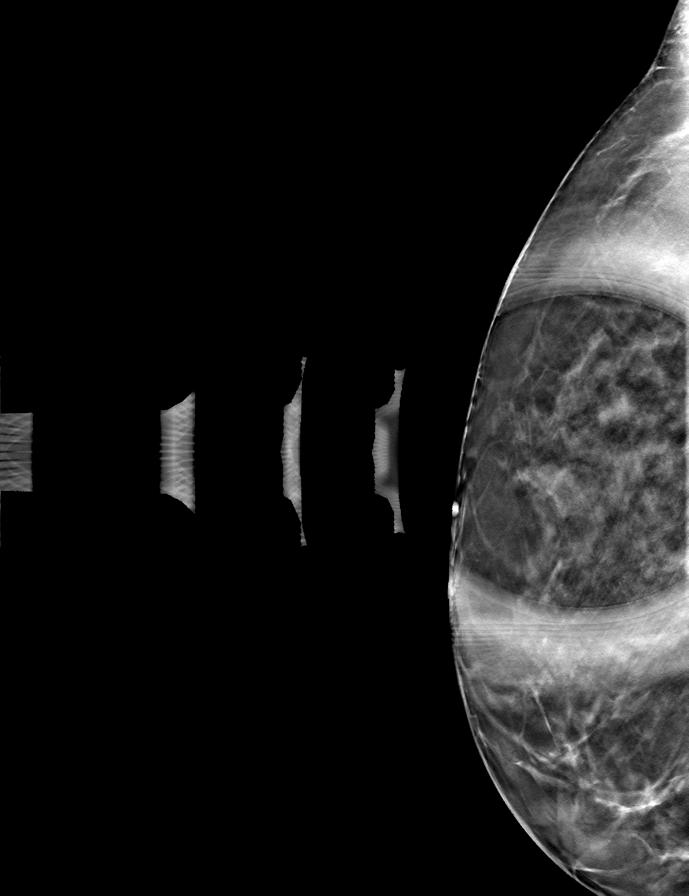

[8 of 40 positions shown; findings below may reference images not displayed]

ACR Breast Density Category b: There are scattered areas of
fibroglandular density.
FINDINGS: There is a mass in the right breast at 12 o'clock correlating with
the site of the patient's symptoms. There is increased density
throughout the superior aspect of the right breast compared to the
left. Spot imaging demonstrates the increased density is likely
dense glandular tissue, asymmetric to the left. No other suspicious
findings are seen in either breast.

On physical exam, there is a lump in the right breast between 11 and
12 o'clock.

Targeted ultrasound is performed, showing there are multiple small
cysts throughout the superior right breast. There is also dense
glandular tissue throughout the superior right breast correlating
with the increased density seen on mammography. Within the dense
tissue is a complicated cyst with a thickened wall and internal
debris measuring 11 x 10 by 11 mm.
IMPRESSION: The patient's symptoms are likely due to a complicated inflamed
right breast cyst at [DATE], 7 cm from the nipple. Multiple other
small cysts are seen throughout the right breast. Dense glandular
tissue throughout the superior right breast explains the density in
this region seen on the mammogram.

RECOMMENDATION:
Options of drainage of versus short-term follow-up or discussed with
the patient. It was decided to follow the complicated cyst in 4
weeks with ultrasound to ensure resolution. The patient will call if
her symptoms worsen and we could schedule her for drainage if
worsening occurs.

I have discussed the findings and recommendations with the patient.
If applicable, a reminder letter will be sent to the patient
regarding the next appointment.

BI-RADS CATEGORY  3: Probably benign.
